# Patient Record
Sex: Male | Born: 1965 | Race: White | Hispanic: No | Marital: Married | Smoking: Never smoker
Health system: Southern US, Community
[De-identification: ages and names within clinical notes are randomized; demographics above are authoritative.]

## PROBLEM LIST (undated history)

## (undated) DIAGNOSIS — R7303 Prediabetes: Secondary | ICD-10-CM

## (undated) DIAGNOSIS — D369 Benign neoplasm, unspecified site: Secondary | ICD-10-CM

## (undated) DIAGNOSIS — N3281 Overactive bladder: Secondary | ICD-10-CM

## (undated) DIAGNOSIS — F1010 Alcohol abuse, uncomplicated: Secondary | ICD-10-CM

## (undated) DIAGNOSIS — K5790 Diverticulosis of intestine, part unspecified, without perforation or abscess without bleeding: Secondary | ICD-10-CM

## (undated) HISTORY — DX: Benign neoplasm, unspecified site: D36.9

## (undated) HISTORY — PX: TENDON REPAIR: SHX5111

## (undated) HISTORY — DX: Diverticulosis of intestine, part unspecified, without perforation or abscess without bleeding: K57.90

## (undated) HISTORY — DX: Prediabetes: R73.03

## (undated) HISTORY — DX: Overactive bladder: N32.81

## (undated) HISTORY — PX: HEMORRHOID BANDING: SHX5850

## (undated) HISTORY — PX: WISDOM TOOTH EXTRACTION: SHX21

## (undated) HISTORY — PX: INGUINAL HERNIA REPAIR: SUR1180

## (undated) HISTORY — PX: COLONOSCOPY W/ POLYPECTOMY: SHX1380

## (undated) HISTORY — PX: CYSTOSCOPY: SUR368

## (undated) HISTORY — DX: Alcohol abuse, uncomplicated: F10.10

## (undated) HISTORY — PX: OTHER SURGICAL HISTORY: SHX169

---

## 2001-08-16 ENCOUNTER — Encounter: Admission: RE | Admit: 2001-08-16 | Discharge: 2001-08-16 | Payer: Self-pay | Admitting: Neurosurgery

## 2001-08-16 ENCOUNTER — Encounter: Payer: Self-pay | Admitting: Neurosurgery

## 2001-08-30 ENCOUNTER — Encounter: Admission: RE | Admit: 2001-08-30 | Discharge: 2001-08-30 | Payer: Self-pay | Admitting: Neurosurgery

## 2001-08-30 ENCOUNTER — Encounter: Payer: Self-pay | Admitting: Neurosurgery

## 2001-09-14 ENCOUNTER — Ambulatory Visit (HOSPITAL_COMMUNITY): Admission: RE | Admit: 2001-09-14 | Discharge: 2001-09-14 | Payer: Self-pay | Admitting: Neurosurgery

## 2001-09-14 ENCOUNTER — Encounter: Payer: Self-pay | Admitting: Neurosurgery

## 2009-10-10 DIAGNOSIS — D369 Benign neoplasm, unspecified site: Secondary | ICD-10-CM

## 2009-10-10 HISTORY — DX: Benign neoplasm, unspecified site: D36.9

## 2009-10-10 HISTORY — PX: COLONOSCOPY W/ BIOPSIES: SHX1374

## 2013-11-04 ENCOUNTER — Encounter: Payer: Self-pay | Admitting: Internal Medicine

## 2013-12-26 ENCOUNTER — Encounter: Payer: Self-pay | Admitting: Internal Medicine

## 2013-12-26 ENCOUNTER — Ambulatory Visit (INDEPENDENT_AMBULATORY_CARE_PROVIDER_SITE_OTHER): Payer: PRIVATE HEALTH INSURANCE | Admitting: Internal Medicine

## 2013-12-26 VITALS — BP 118/72 | HR 80 | Ht 70.5 in | Wt 258.5 lb

## 2013-12-26 DIAGNOSIS — Z86010 Personal history of colonic polyps: Secondary | ICD-10-CM | POA: Insufficient documentation

## 2013-12-26 DIAGNOSIS — K429 Umbilical hernia without obstruction or gangrene: Secondary | ICD-10-CM

## 2013-12-26 DIAGNOSIS — K5792 Diverticulitis of intestine, part unspecified, without perforation or abscess without bleeding: Secondary | ICD-10-CM | POA: Insufficient documentation

## 2013-12-26 DIAGNOSIS — K5732 Diverticulitis of large intestine without perforation or abscess without bleeding: Secondary | ICD-10-CM

## 2013-12-26 DIAGNOSIS — K648 Other hemorrhoids: Secondary | ICD-10-CM | POA: Insufficient documentation

## 2013-12-26 MED ORDER — METRONIDAZOLE 500 MG PO TABS: 500.0000 mg | ORAL_TABLET | Freq: Three times a day (TID) | ORAL | Status: DC

## 2013-12-26 MED ORDER — CIPROFLOXACIN HCL 500 MG PO TABS: 500.0000 mg | ORAL_TABLET | Freq: Two times a day (BID) | ORAL | Status: DC

## 2013-12-26 NOTE — Assessment & Plan Note (Signed)
He has a hx compatible with this. Will Rx cipro and metronidazole to have at home and he is to call if he needs to start it.

## 2013-12-26 NOTE — Assessment & Plan Note (Signed)
RA banded today RTC 2-4 weeks for repeat banding

## 2013-12-26 NOTE — Assessment & Plan Note (Signed)
Recall colonoscopy 10/2014

## 2013-12-26 NOTE — Assessment & Plan Note (Signed)
Small and Asx - observation seems ok - he can schedule [facility] eval if desired
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
## 2013-12-26 NOTE — Patient Instructions (Addendum)
HEMORRHOID BANDING PROCEDURE    FOLLOW-UP CARE   1. The procedure you have had should have been relatively painless since the banding of the area involved does not have nerve endings and there is no pain sensation.  The rubber band cuts off the blood supply to the hemorrhoid and the band may fall off as soon as 48 hours after the banding (the band may occasionally be seen in the toilet bowl following a bowel movement). You may notice a temporary feeling of fullness in the rectum which should respond adequately to plain Tylenol or Motrin.  2. Following the banding, avoid strenuous exercise that evening and resume full activity the next day.  A sitz bath (soaking in a warm tub) or bidet is soothing, and can be useful for cleansing the area after bowel movements.     3. To avoid constipation, take two tablespoons of natural wheat bran, natural oat bran, flax, Benefiber or any over the counter fiber supplement and increase your water intake to 7-8 glasses daily.    4. Unless you have been prescribed anorectal medication, do not put anything inside your rectum for two weeks: No suppositories, enemas, fingers, etc.  5. Occasionally, you may have more bleeding than usual after the banding procedure.  This is often from the untreated hemorrhoids rather than the treated one.  Don't be concerned if there is a tablespoon or so of blood.  If there is more blood than this, lie flat with your bottom higher than your head and apply an ice pack to the area. If the bleeding does not stop within a half an hour or if you feel faint, call our office at [phone] or go to the emergency room.  6. Problems are not common; however, if there is a substantial amount of bleeding, severe pain, chills, fever or difficulty passing urine (very rare) or other problems, you should call us at [phone] or report to the nearest emergency room.  7. Do not stay seated continuously for more than 2-3 hours for a day or two  after the procedure.  Tighten your buttock muscles 10-15 times every two hours and take 10-15 deep breaths every 1-2 hours.  Do not spend more than a few minutes on the toilet if you cannot empty your bowel; instead re-visit the toilet at a later time.    We have sent the following medications to your pharmacy for you to pick up at your convenience: Flagyl and Cipro . You will have these on hand to use as needed.  Call us if you do have to use them.  We will put you in the system for a colon recall January 2016.  We will see you back at your next banding appointment.  We have given you information to read today on hemorrhoids.  I appreciate the opportunity to care for you.

## 2014-02-17 ENCOUNTER — Encounter: Payer: Self-pay | Admitting: Internal Medicine

## 2014-02-17 ENCOUNTER — Ambulatory Visit (INDEPENDENT_AMBULATORY_CARE_PROVIDER_SITE_OTHER): Payer: PRIVATE HEALTH INSURANCE | Admitting: Internal Medicine

## 2014-02-17 VITALS — BP 110/70 | HR 80 | Ht 70.5 in | Wt 257.8 lb

## 2014-02-17 DIAGNOSIS — K648 Other hemorrhoids: Secondary | ICD-10-CM

## 2014-02-17 NOTE — Assessment & Plan Note (Signed)
Notes some improvement in Sxs RP and LL banded He accepted slight increased risk of complications with banding x 2 today He has decided to f/u prn after waiting 2 months to see how he is

## 2014-02-17 NOTE — Progress Notes (Signed)
Patient ID: Francisco Carpenter, male   DOB: Oct 04, 1966, 48 y.o.   MRN: [id number]         The patient returns after I banded RA hemorrhoid column about 6-7 weeks ago. He believes there is improvement in sxs of fecal seepage, itching and rectal bleeding.  PROCEDURE NOTE: The patient presents with symptomatic grade 2  hemorrhoids, requesting rubber band ligation of his/her hemorrhoidal disease.  All risks, benefits and alternative forms of therapy were described and informed consent was obtained.   The anorectum was pre-medicated with 5% lidocaine cream and 0.125% NTG ointment The decision was made to  Band the RP and LL internal hemorrhoids  and the [place] was used to perform band ligation without complication.  Digital anorectal examination was then performed to assure proper positioning of the band, and to adjust the banded tissue as required.  The patient was discharged home without pain or other issues.  Dietary and behavioral recommendations were given and along with follow-up instructions.     The patient will return as needed for  follow-up and possible additional banding as required. No complications were encountered and the patient tolerated the procedure well.  I appreciate the opportunity to care for this patient.

## 2014-02-17 NOTE — Patient Instructions (Signed)
HEMORRHOID BANDING PROCEDURE    FOLLOW-UP CARE   1. The procedure you have had should have been relatively painless since the banding of the area involved does not have nerve endings and there is no pain sensation.  The rubber band cuts off the blood supply to the hemorrhoid and the band may fall off as soon as 48 hours after the banding (the band may occasionally be seen in the toilet bowl following a bowel movement). You may notice a temporary feeling of fullness in the rectum which should respond adequately to plain Tylenol or Motrin.  2. Following the banding, avoid strenuous exercise that evening and resume full activity the next day.  A sitz bath (soaking in a warm tub) or bidet is soothing, and can be useful for cleansing the area after bowel movements.     3. To avoid constipation, take two tablespoons of natural wheat bran, natural oat bran, flax, Benefiber or any over the counter fiber supplement and increase your water intake to 7-8 glasses daily.    4. Unless you have been prescribed anorectal medication, do not put anything inside your rectum for two weeks: No suppositories, enemas, fingers, etc.  5. Occasionally, you may have more bleeding than usual after the banding procedure.  This is often from the untreated hemorrhoids rather than the treated one.  Don't be concerned if there is a tablespoon or so of blood.  If there is more blood than this, lie flat with your bottom higher than your head and apply an ice pack to the area. If the bleeding does not stop within a half an hour or if you feel faint, call our office at [phone] or go to the emergency room.  6. Problems are not common; however, if there is a substantial amount of bleeding, severe pain, chills, fever or difficulty passing urine (very rare) or other problems, you should call us at [phone] or report to the nearest emergency room.  7. Do not stay seated continuously for more than 2-3 hours for a day or two  after the procedure.  Tighten your buttock muscles 10-15 times every two hours and take 10-15 deep breaths every 1-2 hours.  Do not spend more than a few minutes on the toilet if you cannot empty your bowel; instead re-visit the toilet at a later time.    Call us back as needed.    I appreciate the opportunity to care for you.

## 2014-08-26 ENCOUNTER — Other Ambulatory Visit: Payer: Self-pay | Admitting: Internal Medicine

## 2014-08-26 NOTE — Telephone Encounter (Signed)
May refill x1 

## 2014-08-26 NOTE — Telephone Encounter (Signed)
Spoke with patient , he had a flare 3 to 4 weeks ago and used his rx so he wants to get another to have if he needs it.  Will this be ok Sir?

## 2014-10-20 ENCOUNTER — Encounter: Payer: Self-pay | Admitting: Internal Medicine

## 2014-12-12 ENCOUNTER — Ambulatory Visit (AMBULATORY_SURGERY_CENTER): Payer: Self-pay

## 2014-12-12 VITALS — Ht 72.0 in | Wt 255.0 lb

## 2014-12-12 DIAGNOSIS — Z860100 Personal history of colon polyps, unspecified: Secondary | ICD-10-CM

## 2014-12-12 NOTE — Progress Notes (Signed)
No allergies to eggs or soy No diet/weight loss meds No home oxygen No past problems with anesthesia  Has email  Emmi instructions given for colonoscopy 

## 2014-12-26 ENCOUNTER — Ambulatory Visit (AMBULATORY_SURGERY_CENTER): Payer: BLUE CROSS/BLUE SHIELD | Admitting: Internal Medicine

## 2014-12-26 ENCOUNTER — Encounter: Payer: Self-pay | Admitting: Internal Medicine

## 2014-12-26 VITALS — BP 101/66 | HR 70 | Temp 97.7°F | Resp 21 | Ht 72.0 in | Wt 255.0 lb

## 2014-12-26 DIAGNOSIS — D128 Benign neoplasm of rectum: Secondary | ICD-10-CM

## 2014-12-26 DIAGNOSIS — D129 Benign neoplasm of anus and anal canal: Secondary | ICD-10-CM

## 2014-12-26 DIAGNOSIS — K5730 Diverticulosis of large intestine without perforation or abscess without bleeding: Secondary | ICD-10-CM

## 2014-12-26 DIAGNOSIS — K648 Other hemorrhoids: Secondary | ICD-10-CM

## 2014-12-26 DIAGNOSIS — Z86010 Personal history of colonic polyps: Secondary | ICD-10-CM

## 2014-12-26 DIAGNOSIS — K621 Rectal polyp: Secondary | ICD-10-CM

## 2014-12-26 MED ORDER — SODIUM CHLORIDE 0.9 % IV SOLN: 500.0000 mL | INTRAVENOUS | Status: DC

## 2014-12-26 NOTE — Patient Instructions (Addendum)
I found and removed 2 tiny polyps. I will let you know pathology results and when to have another routine colonoscopy by mail.  If you feel like the hemorrhoids are bleeding too much please return to see me about additional banding.  I appreciate the opportunity to care for you. Gatha Mayer, MD, FACG  YOU HAD AN ENDOSCOPIC PROCEDURE TODAY AT [facility]:   Refer to the procedure report that was given to you for any specific questions about what was found during the examination.  If the procedure report does not answer your questions, please call your gastroenterologist to clarify.  If you requested that your care partner not be given the details of your procedure findings, then the procedure report has been included in a sealed envelope for you to review at your convenience later.  YOU SHOULD EXPECT: Some feelings of bloating in the abdomen. Passage of more gas than usual.  Walking can help get rid of the air that was put into your GI tract during the procedure and reduce the bloating. If you had a lower endoscopy (such as a colonoscopy or flexible sigmoidoscopy) you may notice spotting of blood in your stool or on the toilet paper. If you underwent a bowel prep for your procedure, you may not have a normal bowel movement for a few days.  Please Note:  You might notice some irritation and congestion in your nose or some drainage.  This is from the oxygen used during your procedure.  There is no need for concern and it should clear up in a day or so.  SYMPTOMS TO REPORT IMMEDIATELY:   Following lower endoscopy (colonoscopy or flexible sigmoidoscopy):  Excessive amounts of blood in the stool  Significant tenderness or worsening of abdominal pains  Swelling of the abdomen that is new, acute  Fever of 100F or higher     For urgent or emergent issues, a gastroenterologist can be reached at any hour by calling [phone].   DIET: Your first meal following the  procedure should be a small meal and then it is ok to progress to your normal diet. Heavy or fried foods are harder to digest and may make you feel nauseous or bloated.  Likewise, meals heavy in dairy and vegetables can increase bloating.  Drink plenty of fluids but you should avoid alcoholic beverages for 24 hours.  ACTIVITY:  You should plan to take it easy for the rest of today and you should NOT DRIVE or use heavy machinery until tomorrow (because of the sedation medicines used during the test).    FOLLOW UP: Our staff will call the number listed on your records the next business day following your procedure to check on you and address any questions or concerns that you may have regarding the information given to you following your procedure. If we do not reach you, we will leave a message.  However, if you are feeling well and you are not experiencing any problems, there is no need to return our call.  We will assume that you have returned to your regular daily activities without incident.  If any biopsies were taken you will be contacted by phone or by letter within the next 1-3 weeks.  Please call us at [phone] if you have not heard about the biopsies in 3 weeks.    SIGNATURES/CONFIDENTIALITY: You and/or your care partner have signed paperwork which will be entered into your electronic medical record.  These signatures attest  to the fact that that the information above on your After Visit Summary has been reviewed and is understood.  Full responsibility of the confidentiality of this discharge information lies with you and/or your care-partner.  Polyp and hemorrhoid information given.

## 2014-12-26 NOTE — Progress Notes (Signed)
Called to room to assist during endoscopic procedure.  Patient ID and intended procedure confirmed with present staff. Received instructions for my participation in the procedure from the performing physician.  

## 2014-12-26 NOTE — Assessment & Plan Note (Signed)
Still having some seepage and rare bleeding. Overall better. Desires repeat treatment to try to resolve problems. Will schedule repeat banding.

## 2014-12-26 NOTE — Op Note (Signed)
[facility]  [address] [place], [id number]   COLONOSCOPY PROCEDURE REPORT  PATIENT: Francisco Carpenter, Francisco Carpenter  MR#: [id number] BIRTHDATE: 12/26/1965 , 48  yrs. old GENDER: male ENDOSCOPIST: Gatha Mayer, MD, [facility] PROCEDURE DATE:  12/26/2014 PROCEDURE:   Colonoscopy, surveillance and Colonoscopy with biopsy First Screening Colonoscopy - Avg.  risk and is 50 yrs.  old or older - No.  Prior Negative Screening - Now for repeat screening. N/A  History of Adenoma - Now for follow-up colonoscopy & has been > or = to 3 yrs.  Yes hx of adenoma.  Has been 3 or more years since last colonoscopy. ASA CLASS:   Class II INDICATIONS:Surveillance due to prior colonic neoplasia and PH Colon Adenoma. MEDICATIONS: Propofol 370 mg IV and Monitored anesthesia care  DESCRIPTION OF PROCEDURE:   After the risks benefits and alternatives of the procedure were thoroughly explained, informed consent was obtained.  The digital rectal exam revealed no abnormalities of the rectum, revealed no prostatic nodules, and revealed the prostate was not enlarged.   The LB [id number] endoscope was introduced through the anus and advanced to the cecum, which was identified by both the appendix and ileocecal valve. No adverse events experienced.   The quality of the prep was good.  (MiraLax was used)  The instrument was then slowly withdrawn as the colon was fully examined.   COLON FINDINGS: Two sessile polyps ranging from 1 to 35mm in size were found in the rectum.  Polypectomies were performed with cold forceps.  The resection was complete, the polyp tissue was completely retrieved and sent to histology.   There was severe diverticulosis noted throughout the entire examined colon. Retroflexed views revealed internal hemorrhoids. The time to cecum = 3.4 Withdrawal time = 11.5   The scope was withdrawn and the procedure completed. COMPLICATIONS: There were no immediate complications.  ENDOSCOPIC  IMPRESSION: 1.   Two sessile polyps ranging from 1 to 49mm in size were found in the rectum; polypectomies were performed with cold forceps 2.   Severe diverticulosis was noted throughout the entire examined colon 3.   Internal hemorrhoids  RECOMMENDATIONS: Timing of repeat colonoscopy will be determined by pathology findings.  eSigned:  Gatha Mayer, MD, [facility] 12/26/2014 2:42 PM   cc:  The Patient

## 2014-12-26 NOTE — Progress Notes (Signed)
Procedure ends, to recovery, report to [place], Therapist, sports. VSS

## 2014-12-29 ENCOUNTER — Telehealth: Payer: Self-pay

## 2014-12-29 NOTE — Telephone Encounter (Signed)
  Follow up Call-  Call back number 12/26/2014  Post procedure Call Back phone  # [phone]  Permission to leave phone message Yes     Patient questions:  Do you have a fever, pain , or abdominal swelling? No. Pain Score  0 *  Have you tolerated food without any problems? Yes.    Have you been able to return to your normal activities? Yes.    Do you have any questions about your discharge instructions: Diet   No. Medications  No. Follow up visit  No.  Do you have questions or concerns about your Care? No.  Actions: * If pain score is 4 or above: No action needed, pain <4.

## 2014-12-31 ENCOUNTER — Encounter: Payer: Self-pay | Admitting: Internal Medicine

## 2014-12-31 NOTE — Progress Notes (Signed)
Quick Note:  Hyperplastic rectal polyps Repeat colon 2026 ______

## 2015-01-20 ENCOUNTER — Telehealth: Payer: Self-pay | Admitting: Internal Medicine

## 2015-01-20 NOTE — Telephone Encounter (Signed)
I spoke with the patient and rescheduled him for 02/20/15

## 2015-01-22 ENCOUNTER — Encounter: Payer: BLUE CROSS/BLUE SHIELD | Admitting: Internal Medicine

## 2015-02-19 ENCOUNTER — Encounter: Payer: Self-pay | Admitting: Internal Medicine

## 2015-02-19 ENCOUNTER — Ambulatory Visit (INDEPENDENT_AMBULATORY_CARE_PROVIDER_SITE_OTHER): Payer: BLUE CROSS/BLUE SHIELD | Admitting: Internal Medicine

## 2015-02-19 VITALS — BP 120/64 | HR 80 | Ht 70.5 in | Wt 258.1 lb

## 2015-02-19 DIAGNOSIS — K648 Other hemorrhoids: Secondary | ICD-10-CM | POA: Diagnosis not present

## 2015-02-19 NOTE — Progress Notes (Signed)
Patient ID: Francisco Carpenter, male   DOB: 1966-10-02, 49 y.o.   MRN: [id number]       He has had some recurrent bleeding and fecal soiling, was banded x 3 2015.  Rectal shows no mass, small LL external hemorrhoid swelling.   Anoscopy was performed with the patient in the left lateral decubitus position while a chaperone was present and revealed Gr 2 internal hemorrhoids LL and RP and Grade 1 RA  PROCEDURE NOTE: The patient presents with symptomatic grade 2  hemorrhoids, requesting rubber band ligation of his/her hemorrhoidal disease.  All risks, benefits and alternative forms of therapy were described and informed consent was obtained.   The anorectum was pre-medicated with 0.125% NTG and 5% liodcaine The decision was made to band the LL and RP internal hemorrhoid, and the [place] was used to perform band ligation without complication.  Digital anorectal examination was then performed to assure proper positioning of the band, and to adjust the banded tissue as required.  The patient was discharged home without pain or other issues.  Dietary and behavioral recommendations were given and along with follow-up instructions.     The following adjunctive treatments were recommended:  Benefiber prn  The patient will return in prn for  follow-up and possible additional banding as required. No complications were encountered and the patient tolerated the procedure well.

## 2015-02-19 NOTE — Patient Instructions (Signed)
HEMORRHOID BANDING PROCEDURE    FOLLOW-UP CARE   1. The procedure you have had should have been relatively painless since the banding of the area involved does not have nerve endings and there is no pain sensation.  The rubber band cuts off the blood supply to the hemorrhoid and the band may fall off as soon as 48 hours after the banding (the band may occasionally be seen in the toilet bowl following a bowel movement). You may notice a temporary feeling of fullness in the rectum which should respond adequately to plain Tylenol or Motrin.  2. Following the banding, avoid strenuous exercise that evening and resume full activity the next day.  A sitz bath (soaking in a warm tub) or bidet is soothing, and can be useful for cleansing the area after bowel movements.     3. To avoid constipation, take two tablespoons of natural wheat bran, natural oat bran, flax, Benefiber or any over the counter fiber supplement and increase your water intake to 7-8 glasses daily.    4. Unless you have been prescribed anorectal medication, do not put anything inside your rectum for two weeks: No suppositories, enemas, fingers, etc.  5. Occasionally, you may have more bleeding than usual after the banding procedure.  This is often from the untreated hemorrhoids rather than the treated one.  Don't be concerned if there is a tablespoon or so of blood.  If there is more blood than this, lie flat with your bottom higher than your head and apply an ice pack to the area. If the bleeding does not stop within a half an hour or if you feel faint, call our office at [phone] or go to the emergency room.  6. Problems are not common; however, if there is a substantial amount of bleeding, severe pain, chills, fever or difficulty passing urine (very rare) or other problems, you should call us at [phone] or report to the nearest emergency room.  7. Do not stay seated continuously for more than 2-3 hours for a day or two  after the procedure.  Tighten your buttock muscles 10-15 times every two hours and take 10-15 deep breaths every 1-2 hours.  Do not spend more than a few minutes on the toilet if you cannot empty your bowel; instead re-visit the toilet at a later time.    Follow up with Dr Gessner as needed.   I appreciate the opportunity to care for you.  

## 2015-02-19 NOTE — Assessment & Plan Note (Signed)
RP and LL banded 

## 2016-03-25 ENCOUNTER — Other Ambulatory Visit: Payer: Self-pay | Admitting: Neurosurgery

## 2016-03-29 NOTE — Progress Notes (Signed)
Please complete pt assessment on DOS. Pre-op instructions provided only. Pt made aware to stop  taking Aspirin, vitamins, fish oil and herbal medications. Do not take any NSAIDs ie: Ibuprofen, Advil, Naproxen, BC and Goody Powder, Voltaren or any medication containing Aspirin. Pt verbalized understanding of all pre-op instructions.

## 2016-03-30 ENCOUNTER — Encounter (HOSPITAL_COMMUNITY)
Admission: RE | Disposition: A | Discharge status: Home / Self Care | Payer: Self-pay | Source: Ambulatory Visit | Attending: Neurosurgery

## 2016-03-30 ENCOUNTER — Ambulatory Visit (HOSPITAL_COMMUNITY): Payer: BLUE CROSS/BLUE SHIELD

## 2016-03-30 ENCOUNTER — Ambulatory Visit (HOSPITAL_COMMUNITY): Payer: BLUE CROSS/BLUE SHIELD | Admitting: Anesthesiology

## 2016-03-30 ENCOUNTER — Encounter (HOSPITAL_COMMUNITY): Payer: Self-pay | Admitting: *Deleted

## 2016-03-30 ENCOUNTER — Ambulatory Visit (HOSPITAL_COMMUNITY)
Admission: RE | Admit: 2016-03-30 | Discharge: 2016-03-30 | Disposition: A | Discharge status: Home / Self Care | Payer: BLUE CROSS/BLUE SHIELD | Source: Ambulatory Visit | Attending: Neurosurgery | Admitting: Neurosurgery

## 2016-03-30 DIAGNOSIS — M5127 Other intervertebral disc displacement, lumbosacral region: Secondary | ICD-10-CM | POA: Diagnosis present

## 2016-03-30 DIAGNOSIS — Z419 Encounter for procedure for purposes other than remedying health state, unspecified: Secondary | ICD-10-CM

## 2016-03-30 DIAGNOSIS — M5126 Other intervertebral disc displacement, lumbar region: Secondary | ICD-10-CM | POA: Diagnosis present

## 2016-03-30 HISTORY — PX: LUMBAR LAMINECTOMY/DECOMPRESSION MICRODISCECTOMY: SHX5026

## 2016-03-30 LAB — CBC
HCT: 40.3 % (ref 39.0–52.0)
Hemoglobin: 13.6 g/dL (ref 13.0–17.0)
MCH: 28.8 pg (ref 26.0–34.0)
MCHC: 33.7 g/dL (ref 30.0–36.0)
MCV: 85.2 fL (ref 78.0–100.0)
PLATELETS: 230 10*3/uL (ref 150–400)
RBC: 4.73 MIL/uL (ref 4.22–5.81)
RDW: 12.1 % (ref 11.5–15.5)
WBC: 5.2 10*3/uL (ref 4.0–10.5)

## 2016-03-30 LAB — SURGICAL PCR SCREEN
MRSA, PCR: NEGATIVE
STAPHYLOCOCCUS AUREUS: NEGATIVE

## 2016-03-30 LAB — COMPREHENSIVE METABOLIC PANEL
ALBUMIN: 4 g/dL (ref 3.5–5.0)
ALT: 30 U/L (ref 17–63)
AST: 23 U/L (ref 15–41)
Alkaline Phosphatase: 47 U/L (ref 38–126)
Anion gap: 8 (ref 5–15)
BUN: 18 mg/dL (ref 6–20)
CHLORIDE: 110 mmol/L (ref 101–111)
CO2: 23 mmol/L (ref 22–32)
Calcium: 9.1 mg/dL (ref 8.9–10.3)
Creatinine, Ser: 0.81 mg/dL (ref 0.61–1.24)
GFR calc Af Amer: >60 mL/min (ref >60)
GLUCOSE: 98 mg/dL (ref 65–99)
POTASSIUM: 4.1 mmol/L (ref 3.5–5.1)
SODIUM: 141 mmol/L (ref 135–145)
Total Bilirubin: 0.7 mg/dL (ref 0.3–1.2)
Total Protein: 6.5 g/dL (ref 6.5–8.1)

## 2016-03-30 SURGERY — LUMBAR LAMINECTOMY/DECOMPRESSION MICRODISCECTOMY 1 LEVEL
Anesthesia: General | Site: Spine Lumbar

## 2016-03-30 MED ORDER — MIDAZOLAM HCL 5 MG/5ML IJ SOLN
INTRAMUSCULAR | Status: DC | PRN
  Administered 2016-03-30: 2 mg via INTRAVENOUS

## 2016-03-30 MED ORDER — LACTATED RINGERS IV SOLN
INTRAVENOUS | Status: DC
  Administered 2016-03-30 (×2): via INTRAVENOUS

## 2016-03-30 MED ORDER — ONDANSETRON HCL 4 MG/2ML IJ SOLN: 4.0000 mg | INTRAMUSCULAR | Status: DC | PRN

## 2016-03-30 MED ORDER — DIAZEPAM 5 MG PO TABS: 5.0000 mg | ORAL_TABLET | Freq: Four times a day (QID) | ORAL | Status: DC | PRN

## 2016-03-30 MED ORDER — FENTANYL CITRATE (PF) 100 MCG/2ML IJ SOLN
INTRAMUSCULAR | Status: DC | PRN
  Administered 2016-03-30: 150 ug via INTRAVENOUS
  Administered 2016-03-30 (×3): 50 ug via INTRAVENOUS

## 2016-03-30 MED ORDER — PROPOFOL 10 MG/ML IV BOLUS
INTRAVENOUS | Status: DC | PRN
  Administered 2016-03-30: 30 mg via INTRAVENOUS
  Administered 2016-03-30: 150 mg via INTRAVENOUS

## 2016-03-30 MED ORDER — SODIUM CHLORIDE 0.9% FLUSH: 3.0000 mL | INTRAVENOUS | Status: DC | PRN

## 2016-03-30 MED ORDER — ONDANSETRON HCL 4 MG/2ML IJ SOLN
INTRAMUSCULAR | Status: AC
  Filled 2016-03-30: qty 2

## 2016-03-30 MED ORDER — LACTATED RINGERS IV SOLN
INTRAVENOUS | Status: DC
Start: 2016-03-30 — End: 2016-03-30
  Administered 2016-03-30: 13:00:00 via INTRAVENOUS

## 2016-03-30 MED ORDER — PROMETHAZINE HCL 25 MG/ML IJ SOLN: 6.2500 mg | INTRAMUSCULAR | Status: DC | PRN

## 2016-03-30 MED ORDER — TAMSULOSIN HCL 0.4 MG PO CAPS: 0.4000 mg | ORAL_CAPSULE | Freq: Every day | ORAL | Status: DC

## 2016-03-30 MED ORDER — BUPIVACAINE HCL (PF) 0.5 % IJ SOLN
INTRAMUSCULAR | Status: DC | PRN
  Administered 2016-03-30: 12 mL

## 2016-03-30 MED ORDER — PHENOL 1.4 % MT LIQD: 1.0000 | OROMUCOSAL | Status: DC | PRN

## 2016-03-30 MED ORDER — FENTANYL CITRATE (PF) 250 MCG/5ML IJ SOLN
INTRAMUSCULAR | Status: AC
  Filled 2016-03-30: qty 5

## 2016-03-30 MED ORDER — LIDOCAINE-EPINEPHRINE 0.5 %-1:200000 IJ SOLN
INTRAMUSCULAR | Status: DC | PRN
  Administered 2016-03-30: 4 mL

## 2016-03-30 MED ORDER — TRAMADOL HCL 50 MG PO TABS
50.0000 mg | ORAL_TABLET | Freq: Four times a day (QID) | ORAL | Status: DC | PRN
Start: 2016-03-30 — End: 2016-03-31

## 2016-03-30 MED ORDER — KETOROLAC TROMETHAMINE 30 MG/ML IJ SOLN: 30.0000 mg | Freq: Four times a day (QID) | INTRAMUSCULAR | Status: DC

## 2016-03-30 MED ORDER — HEMOSTATIC AGENTS (NO CHARGE) OPTIME
TOPICAL | Status: DC | PRN
  Administered 2016-03-30: 1 via TOPICAL

## 2016-03-30 MED ORDER — HYDROCODONE-ACETAMINOPHEN 7.5-325 MG PO TABS: 1.0000 | ORAL_TABLET | Freq: Once | ORAL | Status: DC | PRN

## 2016-03-30 MED ORDER — TIZANIDINE HCL 4 MG PO TABS: 4.0000 mg | ORAL_TABLET | Freq: Four times a day (QID) | ORAL | Status: DC | PRN

## 2016-03-30 MED ORDER — MIDAZOLAM HCL 2 MG/2ML IJ SOLN
INTRAMUSCULAR | Status: AC
  Filled 2016-03-30: qty 2

## 2016-03-30 MED ORDER — ONDANSETRON HCL 4 MG/2ML IJ SOLN
INTRAMUSCULAR | Status: DC | PRN
  Administered 2016-03-30: 4 mg via INTRAVENOUS

## 2016-03-30 MED ORDER — CERAVE EX OINT: 1.0000 "application " | TOPICAL_OINTMENT | Freq: Every day | CUTANEOUS | Status: DC | PRN

## 2016-03-30 MED ORDER — PROPOFOL 10 MG/ML IV BOLUS
INTRAVENOUS | Status: AC
  Filled 2016-03-30: qty 20

## 2016-03-30 MED ORDER — HYDROMORPHONE HCL 1 MG/ML IJ SOLN
INTRAMUSCULAR | Status: AC
  Administered 2016-03-30: 0.5 mg via INTRAVENOUS
  Filled 2016-03-30: qty 1

## 2016-03-30 MED ORDER — ROCURONIUM BROMIDE 100 MG/10ML IV SOLN
INTRAVENOUS | Status: DC | PRN
  Administered 2016-03-30: 50 mg via INTRAVENOUS

## 2016-03-30 MED ORDER — OXYBUTYNIN CHLORIDE ER 10 MG PO TB24: 10.0000 mg | ORAL_TABLET | Freq: Every day | ORAL | Status: DC

## 2016-03-30 MED ORDER — LIDOCAINE HCL (CARDIAC) 20 MG/ML IV SOLN
INTRAVENOUS | Status: DC | PRN
  Administered 2016-03-30: 80 mg via INTRAVENOUS

## 2016-03-30 MED ORDER — HYDROCODONE-ACETAMINOPHEN 5-325 MG PO TABS: 1.0000 | ORAL_TABLET | ORAL | Status: DC | PRN

## 2016-03-30 MED ORDER — ROCURONIUM BROMIDE 50 MG/5ML IV SOLN
INTRAVENOUS | Status: AC
  Filled 2016-03-30: qty 1

## 2016-03-30 MED ORDER — THROMBIN 5000 UNITS EX SOLR
CUTANEOUS | Status: DC | PRN
  Administered 2016-03-30 (×2): 5000 [IU] via TOPICAL

## 2016-03-30 MED ORDER — PHENYLEPHRINE HCL 10 MG/ML IJ SOLN
INTRAMUSCULAR | Status: DC | PRN
  Administered 2016-03-30: 40 ug via INTRAVENOUS

## 2016-03-30 MED ORDER — SODIUM CHLORIDE 0.9 % IV SOLN: 250.0000 mL | INTRAVENOUS | Status: DC

## 2016-03-30 MED ORDER — MUPIROCIN 2 % EX OINT
TOPICAL_OINTMENT | CUTANEOUS | Status: AC
  Filled 2016-03-30: qty 22

## 2016-03-30 MED ORDER — PHENYLEPHRINE 40 MCG/ML (10ML) SYRINGE FOR IV PUSH (FOR BLOOD PRESSURE SUPPORT)
PREFILLED_SYRINGE | INTRAVENOUS | Status: AC
  Filled 2016-03-30: qty 10

## 2016-03-30 MED ORDER — HYDROMORPHONE HCL 1 MG/ML IJ SOLN: 0.5000 mg | INTRAMUSCULAR | Status: DC | PRN

## 2016-03-30 MED ORDER — ACETAMINOPHEN 650 MG RE SUPP: 650.0000 mg | RECTAL | Status: DC | PRN

## 2016-03-30 MED ORDER — 0.9 % SODIUM CHLORIDE (POUR BTL) OPTIME
TOPICAL | Status: DC | PRN
  Administered 2016-03-30: 1000 mL

## 2016-03-30 MED ORDER — SODIUM CHLORIDE 0.9% FLUSH: 3.0000 mL | Freq: Two times a day (BID) | INTRAVENOUS | Status: DC

## 2016-03-30 MED ORDER — POTASSIUM CHLORIDE IN NACL 20-0.9 MEQ/L-% IV SOLN
INTRAVENOUS | Status: DC
Start: 2016-03-30 — End: 2016-03-31
  Filled 2016-03-30 (×2): qty 1000

## 2016-03-30 MED ORDER — ACETAMINOPHEN 325 MG PO TABS: 650.0000 mg | ORAL_TABLET | ORAL | Status: DC | PRN

## 2016-03-30 MED ORDER — CEFAZOLIN SODIUM-DEXTROSE 2-4 GM/100ML-% IV SOLN
2.0000 g | INTRAVENOUS | Status: AC
  Administered 2016-03-30: 2 g via INTRAVENOUS
  Filled 2016-03-30: qty 100

## 2016-03-30 MED ORDER — SUGAMMADEX SODIUM 200 MG/2ML IV SOLN
INTRAVENOUS | Status: DC | PRN
  Administered 2016-03-30: 215 mg via INTRAVENOUS

## 2016-03-30 MED ORDER — MENTHOL 3 MG MT LOZG: 1.0000 | LOZENGE | OROMUCOSAL | Status: DC | PRN

## 2016-03-30 MED ORDER — HYDROMORPHONE HCL 1 MG/ML IJ SOLN
0.2500 mg | INTRAMUSCULAR | Status: DC | PRN
  Administered 2016-03-30 (×3): 0.5 mg via INTRAVENOUS

## 2016-03-30 SURGICAL SUPPLY — 57 items
APL SKNCLS STERI-STRIP NONHPOA (GAUZE/BANDAGES/DRESSINGS)
BAG DECANTER FOR FLEXI CONT (MISCELLANEOUS) ×1 IMPLANT
BENZOIN TINCTURE PRP APPL 2/3 (GAUZE/BANDAGES/DRESSINGS) IMPLANT
BLADE CLIPPER SURG (BLADE) ×1 IMPLANT
BUR MATCHSTICK NEURO 3.0 LAGG (BURR) ×2 IMPLANT
CANISTER SUCT 3000ML PPV (MISCELLANEOUS) ×2 IMPLANT
DECANTER SPIKE VIAL GLASS SM (MISCELLANEOUS) ×2 IMPLANT
DRAPE LAPAROTOMY 100X72X124 (DRAPES) ×2 IMPLANT
DRAPE MICROSCOPE LEICA (MISCELLANEOUS) ×2 IMPLANT
DRAPE POUCH INSTRU U-SHP 10X18 (DRAPES) ×2 IMPLANT
DRAPE SURG 17X23 STRL (DRAPES) ×2 IMPLANT
DURAPREP 26ML APPLICATOR (WOUND CARE) ×2 IMPLANT
ELECT REM PT RETURN 9FT ADLT (ELECTROSURGICAL) ×2
ELECTRODE REM PT RTRN 9FT ADLT (ELECTROSURGICAL) ×1 IMPLANT
GAUZE SPONGE 4X4 12PLY STRL (GAUZE/BANDAGES/DRESSINGS) IMPLANT
GAUZE SPONGE 4X4 16PLY XRAY LF (GAUZE/BANDAGES/DRESSINGS) IMPLANT
GLOVE BIO SURGEON STRL SZ 6.5 (GLOVE) ×2 IMPLANT
GLOVE BIOGEL PI IND STRL 6.5 (GLOVE) IMPLANT
GLOVE BIOGEL PI IND STRL 7.0 (GLOVE) IMPLANT
GLOVE BIOGEL PI IND STRL 7.5 (GLOVE) IMPLANT
GLOVE BIOGEL PI INDICATOR 6.5 (GLOVE) ×1
GLOVE BIOGEL PI INDICATOR 7.0 (GLOVE) ×1
GLOVE BIOGEL PI INDICATOR 7.5 (GLOVE) ×1
GLOVE ECLIPSE 6.5 STRL STRAW (GLOVE) ×2 IMPLANT
GLOVE EXAM NITRILE LRG STRL (GLOVE) IMPLANT
GLOVE EXAM NITRILE MD LF STRL (GLOVE) IMPLANT
GLOVE EXAM NITRILE XL STR (GLOVE) IMPLANT
GLOVE EXAM NITRILE XS STR PU (GLOVE) IMPLANT
GLOVE SS BIOGEL STRL SZ 7 (GLOVE) IMPLANT
GLOVE SUPERSENSE BIOGEL SZ 7 (GLOVE) ×1
GLOVE SURG SS PI 6.5 STRL IVOR (GLOVE) ×2 IMPLANT
GOWN STRL REUS W/ TWL LRG LVL3 (GOWN DISPOSABLE) ×2 IMPLANT
GOWN STRL REUS W/ TWL XL LVL3 (GOWN DISPOSABLE) IMPLANT
GOWN STRL REUS W/TWL 2XL LVL3 (GOWN DISPOSABLE) IMPLANT
GOWN STRL REUS W/TWL LRG LVL3 (GOWN DISPOSABLE) ×8
GOWN STRL REUS W/TWL XL LVL3 (GOWN DISPOSABLE)
KIT BASIN OR (CUSTOM PROCEDURE TRAY) ×2 IMPLANT
KIT ROOM TURNOVER OR (KITS) ×2 IMPLANT
LIQUID BAND (GAUZE/BANDAGES/DRESSINGS) ×2 IMPLANT
NDL HYPO 25X1 1.5 SAFETY (NEEDLE) ×1 IMPLANT
NDL SPNL 18GX3.5 QUINCKE PK (NEEDLE) IMPLANT
NEEDLE HYPO 25X1 1.5 SAFETY (NEEDLE) ×2 IMPLANT
NEEDLE SPNL 18GX3.5 QUINCKE PK (NEEDLE) ×2 IMPLANT
NS IRRIG 1000ML POUR BTL (IV SOLUTION) ×2 IMPLANT
PACK LAMINECTOMY NEURO (CUSTOM PROCEDURE TRAY) ×2 IMPLANT
PAD ARMBOARD 7.5X6 YLW CONV (MISCELLANEOUS) ×8 IMPLANT
RUBBERBAND STERILE (MISCELLANEOUS) ×4 IMPLANT
SPONGE LAP 4X18 X RAY DECT (DISPOSABLE) IMPLANT
SPONGE SURGIFOAM ABS GEL SZ50 (HEMOSTASIS) ×2 IMPLANT
STRIP CLOSURE SKIN 1/2X4 (GAUZE/BANDAGES/DRESSINGS) IMPLANT
SUT VIC AB 0 CT1 18XCR BRD8 (SUTURE) ×1 IMPLANT
SUT VIC AB 0 CT1 8-18 (SUTURE) ×2
SUT VIC AB 2-0 CT1 18 (SUTURE) ×2 IMPLANT
SUT VIC AB 3-0 SH 8-18 (SUTURE) ×2 IMPLANT
TOWEL OR 17X24 6PK STRL BLUE (TOWEL DISPOSABLE) ×2 IMPLANT
TOWEL OR 17X26 10 PK STRL BLUE (TOWEL DISPOSABLE) ×2 IMPLANT
WATER STERILE IRR 1000ML POUR (IV SOLUTION) ×2 IMPLANT

## 2016-03-30 NOTE — Op Note (Signed)
03/30/2016  5:03 PM  PATIENT:  Claudette Stapler  50 y.o. 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 Buczek was taken to the operating room, intubated and placed under a general anesthetic without difficulty. He was positioned prone on a Wilson frame with all pressure points padded. His back was prepped and draped in a sterile manner. I opened the skin with a 10 blade and carried the dissection down to the thoracolumbar fascia. I used both sharp dissection and the monopolar cautery to expose the lamina of L5, and S1. I confirmed my location with an intraoperative xray.  I used the straight curette to separate the ligamentum flavum from the L5 lamina, then I used the punches to remove the ligamentum flavum to expose the thecal sac. I brought the microscope into the operative field and with Dr.Ditty's assistance we started our decompression of the spinal canal, thecal sac and S1 root(s). I cauterized epidural veins overlying the disc space then divided them sharply. I opened the disc space with a 15 blade and proceeded with the discectomy. I used pituitary rongeurs, curettes, and other instruments to remove disc material. After the discectomy was completed we inspected the S1, nerve root and felt it was well decompressed. I explored rostrally, laterally, medially, and caudally and was satisfied with the decompression. I irrigated the wound, then closed in layers. I  approximated the thoracolumbar fascia, subcutaneous, and subcuticular planes with vicryl sutures. I used dermabond for a sterile dressing.   PLAN OF CARE: Admit for overnight observation  PATIENT DISPOSITION:  PACU - hemodynamically stable.   Delay start of Pharmacological VTE agent (>24hrs) due to surgical blood loss or risk of bleeding:  yes

## 2016-03-30 NOTE — Anesthesia Procedure Notes (Signed)
Procedure Name: Intubation Date/Time: 03/30/2016 2:53 PM Performed by: Lavell Luster Pre-anesthesia Checklist: Patient identified, Emergency Drugs available, Patient being monitored, Suction available and Timeout performed Patient Re-evaluated:Patient Re-evaluated prior to inductionOxygen Delivery Method: Circle system utilized Preoxygenation: Pre-oxygenation with 100% oxygen Intubation Type: IV induction Ventilation: Mask ventilation without difficulty Laryngoscope Size: Mac and 4 Grade View: Grade I Tube type: Oral Tube size: 7.5 mm Number of attempts: 1 Airway Equipment and Method: Stylet Placement Confirmation: ETT inserted through vocal cords under direct vision,  positive ETCO2 and breath sounds checked- equal and bilateral Secured at: 21 cm Tube secured with: Tape Dental Injury: Teeth and Oropharynx as per pre-operative assessment

## 2016-03-30 NOTE — Progress Notes (Signed)
Discharged instructions/educaton given to patient with wife at bedside and they both verbalized understanding. Patient moving and walking well, no c/o numbness or tingling on lowe extremities. Pain is low to moderate. Patient voiding well with no difficulty. Patient tolerated his dinner well. Patient d/c to home via wheelchair.

## 2016-03-30 NOTE — Discharge Summary (Signed)
  Physician Discharge Summary  Patient ID: AHMEIR STEPKA MRN: [id number] DOB/AGE: January 27, 1966 50 y.o.  Admit date: 03/30/2016 Discharge date: 03/30/2016  Admission Diagnoses:recurrent HNP L5/s1 left  Discharge Diagnoses: same Active Problems:   HNP (herniated nucleus pulposus), lumbar   Discharged Condition: good  Hospital Course: Mr. Defina was admitted and taken to the operating room for an uncomplicated redo discetomy at L5/S1 on the left. Post op his wound is clean, dry, and without signs of infection. He is moving his lower extremities well. He has voided, ambulated, and tolerated a regular diet.  Treatments: surgery: Redo discetomy L L5/S1  Discharge Exam: Blood pressure 140/85, pulse 73, temperature 97 F (36.1 C), temperature source Oral, resp. rate 24, height 6' (1.829 m), weight 107.502 kg (237 lb), SpO2 92 %. General appearance: alert, cooperative, appears stated age and no distress Neurologic: Alert and oriented X 3, normal strength and tone. Normal symmetric reflexes. Normal coordination and gait  Disposition:  Displacement of lumbosacral intervertebral disc    Medication List    TAKE these medications        CERAVE Oint  Apply 1 application topically daily as needed (dry skin).     diclofenac 75 MG EC tablet  Commonly known as:  VOLTAREN  Take 75 mg by mouth 2 (two) times daily as needed (pain/ inflammation).     oxybutynin 10 MG 24 hr tablet  Commonly known as:  DITROPAN-XL  Take 10 mg by mouth daily.     tamsulosin 0.4 MG Caps capsule  Commonly known as:  FLOMAX  Take 0.4 mg by mouth at bedtime.     tiZANidine 4 MG tablet  Commonly known as:  ZANAFLEX  Take 1 tablet (4 mg total) by mouth every 6 (six) hours as needed for muscle spasms.     traMADol 50 MG tablet  Commonly known as:  ULTRAM  Take 50 mg by mouth every 6 (six) hours as needed (pain).           Follow-up Information    Follow up with Mccoy Testa L, MD In 3 weeks.   Specialty:   Neurosurgery   Why:  call the office to make an appointment please   Contact information:   [address] [place] [address] [phone]       Signed: Winfield Cunas 03/30/2016, 5:58 PM

## 2016-03-30 NOTE — Transfer of Care (Signed)
Immediate Anesthesia Transfer of Care Note  Patient: Francisco Carpenter  Procedure(s) Performed: Procedure(s): Recurrent Lumbar five-sacral one Microdiskectomy (N/A)  Patient Location: PACU  Anesthesia Type:General  Level of Consciousness: awake, alert  and oriented  Airway & Oxygen Therapy: Patient connected to nasal cannula oxygen  Post-op Assessment: Report given to RN  Post vital signs: stable  Last Vitals:  Filed Vitals:   03/30/16 1159  BP: 118/74  Pulse: 58  Temp: 36.7 C  Resp: 20    Last Pain:  Filed Vitals:   03/30/16 1239  PainSc: 4       Patients Stated Pain Goal: 1 ([id number])  Complications: No apparent anesthesia complications

## 2016-03-30 NOTE — H&P (Signed)
BP 118/74 mmHg  Pulse 58  Temp(Src) 98 F (36.7 C) (Oral)  Resp 20  Ht 6' (1.829 m)  Wt 107.502 kg (237 lb)  BMI 32.14 kg/m2  SpO2 97%  Francisco Carpenter is a patient of mine whom I had taken to the operating room he says about ten to twelve years ago for what was most likely an L5-S1 displaced disc on the left side.  He says I did the operation and he did better, and after just some post ops, he has just returned today.  He says over the last few weeks, he has had some pain in the left lower extremity, but over the past few years he always has a twinge, but the pain would go away after a day or a few hours.  It is lasting longer now, and the occurrences are just more frequent.  He says he has gotten much worse over the last ten days.  He is unable to sleep, he cannot walk his dog, he refused to go on a walk with his wife this past Sunday.  He says standing may offer some temporary relief, but the pain will be present again after some time while he stands.  He has no discomfort on the right side.  He has no bladder or bowel dysfunction.  He is right-handed.  He is 50 years of age.     REVIEW OF SYSTEMS:                        Review of systems is positive for tinnitus, leg pain with walking, and leg pain at rest.  Full review of systems was performed and noted in my note today.  He does have tinnitus which is ongoing, and he says he has a current leg problem.     CURRENT MEDICAL PROBLEMS:         He describes the pain as excruciating in his left lower extremity.  Numbness in his left foot.     PAST SURGICAL HISTORY:                  The operation for his back was 2005.     CURRENT MEDICATIONS:                     He takes Oxybutynin and Tolterodine.     FAMILY HISTORY:                                Mother is 53.  Father is 54.  Both are in good health.     SOCIAL HISTORY:                                He does not use alcohol.  He does not have a history of substance abuse nor does not  smoke.     PHYSICAL EXAMINATION:                    Vital signs height 5 feet 11.5 inches, weight 243.8 pounds, blood pressure is 121/80, pulse is 60, temperature is 98.1, respiratory rate is 14.  Pain is 7/10.     On examination he is alert, oriented x4, and answers all questions appropriately.  Memory, language, attention span, and fund of knowledge is normal.  Speech is clear, it is also fluent.  Tongue and uvula in the midline.  Shoulder shrug is normal.  Hearing is intact to voice.  Uvula elevates in midline.  Shoulder shrug is normal.  Romberg test is negative.     Francisco Carpenter returned today with the MRI and plain x-rays of the lumbar spine.  The MRI shows a recurring disc herniation at L5-S1 eccentric to the left side.  I do believe this is the reason for the pain that he is experiencing in his back and left lower extremity.       IMPRESSION/PLAN:                             I have offered, and he has agreed, to undergo an operative decompression of the S1 nerve root.  He has full understanding of the procedure having been through this approximately eight years ago at the same level. Risks and benefits of bleeding, infection, bowel or bladder dysfunction, need for further surgery, and recurrent surgery were all discussed.

## 2016-03-30 NOTE — Anesthesia Preprocedure Evaluation (Addendum)
Anesthesia Evaluation  Patient identified by MRN, date of birth, ID band Patient awake    Reviewed: Allergy & Precautions, NPO status , Patient's Chart, lab work & pertinent test results  Airway Mallampati: II  TM Distance: >3 FB Neck ROM: Full    Dental  (+) Dental Advisory Given   Pulmonary neg pulmonary ROS,    breath sounds clear to auscultation       Cardiovascular negative cardio ROS   Rhythm:Regular Rate:Normal     Neuro/Psych negative neurological ROS     GI/Hepatic negative GI ROS, Neg liver ROS,   Endo/Other  negative endocrine ROS  Renal/GU negative Renal ROS     Musculoskeletal   Abdominal   Peds  Hematology negative hematology ROS (+)   Anesthesia Other Findings   Reproductive/Obstetrics                            Lab Results  Component Value Date   WBC 5.2 03/30/2016   HGB 13.6 03/30/2016   HCT 40.3 03/30/2016   MCV 85.2 03/30/2016   PLT 230 03/30/2016   Lab Results  Component Value Date   CREATININE 0.81 03/30/2016   BUN 18 03/30/2016   NA 141 03/30/2016   K 4.1 03/30/2016   CL 110 03/30/2016   CO2 23 03/30/2016    Anesthesia Physical Anesthesia Plan  ASA: II  Anesthesia Plan: General   Post-op Pain Management:    Induction: Intravenous  Airway Management Planned: Oral ETT  Additional Equipment:   Intra-op Plan:   Post-operative Plan: Extubation in OR  Informed Consent: I have reviewed the patients History and Physical, chart, labs and discussed the procedure including the risks, benefits and alternatives for the proposed anesthesia with the patient or authorized representative who has indicated his/her understanding and acceptance.   Dental advisory given  Plan Discussed with: CRNA  Anesthesia Plan Comments:         Anesthesia Quick Evaluation

## 2016-03-30 NOTE — Discharge Instructions (Signed)
Lumbar Discectomy °Care After °A discectomy involves removal of discmaterial (the cartilage-like structures located between the bones of the back). It is done to relieve pressure on nerve roots. It can be used as a treatment for a back problem. The time in surgery depends on the findings in surgery and what is necessary to correct the problems. °HOME CARE INSTRUCTIONS  °· Check the cut (incision) made by the surgeon twice a day for signs of infection. Some signs of infection may include:  °· A foul smelling, greenish or yellowish discharge from the wound.  °· Increased pain.  °· Increased redness over the incision (operative) site.  °· The skin edges may separate.  °· Flu-like symptoms (problems).  °· A temperature above 101.5° F (38.6° C).  °· Change your bandages in about 24 to 36 hours following surgery or as directed.  °· You may shower tomrrow.  Avoid bathtubs, swimming pools and hot tubs for three weeks or until your incision has healed completely. °· Follow your doctor's instructions as to safe activities, exercises, and physical therapy.  °· Weight reduction may be beneficial if you are overweight.  °· Daily exercise is helpful to prevent the return of problems. Walking is permitted. You may use a treadmill without an incline. Cut down on activities and exercise if you have discomfort. You may also go up and down stairs as much as you can tolerate.  °· DO NOT lift anything heavier than 10 to 15 lbs. Avoid bending or twisting at the waist. Always bend your knees when lifting.  °· Maintain strength and range of motion as instructed.  °· Do not drive for 10 days, or as directed by your doctors. You may be a passenger . Lying back in the passenger seat may be more comfortable for you. Always wear a seatbelt.  °· Limit your sitting in a regular chair to 20 to 30 minutes at a time. There are no limitations for sitting in a recliner. You should lie down or walk in between sitting periods.  °· Only take  over-the-counter or prescription medicines for pain, discomfort, or fever as directed by your caregiver.  °SEEK MEDICAL CARE IF:  °· There is increased bleeding (more than a small spot) from the wound.  °· You notice redness, swelling, or increasing pain in the wound.  °· Pus is coming from wound.  °· You develop an unexplained oral temperature above 102° F (38.9° C) develops.  °· You notice a foul smell coming from the wound or dressing.  °· You have increasing pain in your wound.  °SEEK IMMEDIATE MEDICAL CARE IF:  °· You develop a rash.  °· You have difficulty breathing.  °· You develop any allergic problems to medicines given.  °Document Released: 08/31/2004 Document Revised: 09/15/2011 Document Reviewed: 12/20/2007 °ExitCare® Patient Information °

## 2016-03-31 ENCOUNTER — Encounter (HOSPITAL_COMMUNITY): Payer: Self-pay | Admitting: Neurosurgery

## 2016-03-31 NOTE — Anesthesia Postprocedure Evaluation (Signed)
Anesthesia Post Note  Patient: Francisco Carpenter  Procedure(s) Performed: Procedure(s) (LRB): Recurrent Lumbar five-sacral one Microdiskectomy (N/A)  Patient location during evaluation: PACU Anesthesia Type: General Level of consciousness: awake and alert Pain management: pain level controlled Vital Signs Assessment: post-procedure vital signs reviewed and stable Respiratory status: spontaneous breathing, nonlabored ventilation, respiratory function stable and patient connected to nasal cannula oxygen Cardiovascular status: blood pressure returned to baseline and stable Postop Assessment: no signs of nausea or vomiting Anesthetic complications: no    Last Vitals:  Filed Vitals:   03/30/16 1830 03/30/16 2000  BP: 122/83 131/79  Pulse: 50 62  Temp: 36.6 C 36.5 C  Resp: 10 22    Last Pain:  Filed Vitals:   03/30/16 2042  PainSc: 3                  Tiajuana Amass

## 2016-11-23 ENCOUNTER — Emergency Department (HOSPITAL_COMMUNITY)
Admission: EM | Admit: 2016-11-23 | Discharge: 2016-11-23 | Disposition: A | Discharge status: Home / Self Care | Payer: BLUE CROSS/BLUE SHIELD | Attending: Emergency Medicine | Admitting: Emergency Medicine

## 2016-11-23 ENCOUNTER — Encounter (HOSPITAL_COMMUNITY): Payer: Self-pay | Admitting: Emergency Medicine

## 2016-11-23 DIAGNOSIS — M545 Low back pain, unspecified: Secondary | ICD-10-CM

## 2016-11-23 MED ORDER — PREDNISONE 10 MG (21) PO TBPK: 10.0000 mg | ORAL_TABLET | Freq: Every day | ORAL | 0 refills | Status: DC

## 2016-11-23 MED ORDER — DICLOFENAC SODIUM 75 MG PO TBEC: 75.0000 mg | DELAYED_RELEASE_TABLET | Freq: Two times a day (BID) | ORAL | 0 refills | Status: DC | PRN

## 2016-11-23 MED ORDER — HYDROCODONE-ACETAMINOPHEN 5-325 MG PO TABS: 2.0000 | ORAL_TABLET | ORAL | 0 refills | Status: DC | PRN
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
## 2016-11-23 NOTE — Discharge Instructions (Signed)
Schedule to see Dr. Cyndy Freeze for recheck.

## 2016-11-23 NOTE — ED Triage Notes (Signed)
Pt c/o pain in right lower back onset yesterday.  Pt radiating down right leh.

## 2017-05-15 ENCOUNTER — Encounter: Payer: Self-pay | Admitting: Neurology

## 2017-06-08 ENCOUNTER — Encounter: Payer: BLUE CROSS/BLUE SHIELD | Admitting: Neurology

## 2017-06-22 ENCOUNTER — Other Ambulatory Visit: Payer: Self-pay | Admitting: Orthopedic Surgery

## 2017-07-07 ENCOUNTER — Ambulatory Visit (HOSPITAL_COMMUNITY)
Admission: RE | Admit: 2017-07-07 | Discharge: 2017-07-07 | Disposition: A | Discharge status: Home / Self Care | Payer: BLUE CROSS/BLUE SHIELD | Source: Ambulatory Visit | Attending: Orthopedic Surgery | Admitting: Orthopedic Surgery

## 2017-07-07 ENCOUNTER — Encounter (HOSPITAL_COMMUNITY): Payer: Self-pay

## 2017-07-07 ENCOUNTER — Encounter (HOSPITAL_COMMUNITY)
Admission: RE | Admit: 2017-07-07 | Discharge: 2017-07-07 | Disposition: A | Discharge status: Home / Self Care | Payer: BLUE CROSS/BLUE SHIELD | Source: Ambulatory Visit | Attending: Orthopedic Surgery | Admitting: Orthopedic Surgery

## 2017-07-07 DIAGNOSIS — Z0183 Encounter for blood typing: Secondary | ICD-10-CM | POA: Insufficient documentation

## 2017-07-07 DIAGNOSIS — I498 Other specified cardiac arrhythmias: Secondary | ICD-10-CM | POA: Insufficient documentation

## 2017-07-07 DIAGNOSIS — J9811 Atelectasis: Secondary | ICD-10-CM | POA: Diagnosis not present

## 2017-07-07 DIAGNOSIS — Z01812 Encounter for preprocedural laboratory examination: Secondary | ICD-10-CM | POA: Insufficient documentation

## 2017-07-07 DIAGNOSIS — Z01818 Encounter for other preprocedural examination: Secondary | ICD-10-CM

## 2017-07-07 LAB — CBC WITH DIFFERENTIAL/PLATELET
BASOS ABS: 0 10*3/uL (ref 0.0–0.1)
Basophils Relative: 0 %
EOS ABS: 0.1 10*3/uL (ref 0.0–0.7)
Eosinophils Relative: 2 %
HCT: 39.8 % (ref 39.0–52.0)
Hemoglobin: 13.5 g/dL (ref 13.0–17.0)
LYMPHS ABS: 1.6 10*3/uL (ref 0.7–4.0)
LYMPHS PCT: 29 %
MCH: 29.6 pg (ref 26.0–34.0)
MCHC: 33.9 g/dL (ref 30.0–36.0)
MCV: 87.3 fL (ref 78.0–100.0)
MONO ABS: 0.7 10*3/uL (ref 0.1–1.0)
Monocytes Relative: 14 %
Neutro Abs: 3 10*3/uL (ref 1.7–7.7)
Neutrophils Relative %: 55 %
Platelets: 240 10*3/uL (ref 150–400)
RBC: 4.56 MIL/uL (ref 4.22–5.81)
RDW: 12.9 % (ref 11.5–15.5)
WBC: 5.4 10*3/uL (ref 4.0–10.5)

## 2017-07-07 LAB — URINALYSIS, ROUTINE W REFLEX MICROSCOPIC
BILIRUBIN URINE: NEGATIVE
GLUCOSE, UA: NEGATIVE mg/dL
Hgb urine dipstick: NEGATIVE
KETONES UR: NEGATIVE mg/dL
Leukocytes, UA: NEGATIVE
Nitrite: NEGATIVE
PH: 7 (ref 5.0–8.0)
PROTEIN: NEGATIVE mg/dL
Specific Gravity, Urine: 1.023 (ref 1.005–1.030)

## 2017-07-07 LAB — COMPREHENSIVE METABOLIC PANEL
ALT: 30 U/L (ref 17–63)
AST: 26 U/L (ref 15–41)
Albumin: 4.4 g/dL (ref 3.5–5.0)
Alkaline Phosphatase: 50 U/L (ref 38–126)
Anion gap: 7 (ref 5–15)
BILIRUBIN TOTAL: 0.6 mg/dL (ref 0.3–1.2)
BUN: 12 mg/dL (ref 6–20)
CO2: 24 mmol/L (ref 22–32)
CREATININE: 1.11 mg/dL (ref 0.61–1.24)
Calcium: 9 mg/dL (ref 8.9–10.3)
Chloride: 108 mmol/L (ref 101–111)
Glucose, Bld: 104 mg/dL — ABNORMAL HIGH (ref 65–99)
Potassium: 3.9 mmol/L (ref 3.5–5.1)
Sodium: 139 mmol/L (ref 135–145)
TOTAL PROTEIN: 6.6 g/dL (ref 6.5–8.1)

## 2017-07-07 LAB — SURGICAL PCR SCREEN
MRSA, PCR: NEGATIVE
STAPHYLOCOCCUS AUREUS: POSITIVE — AB

## 2017-07-07 LAB — PROTIME-INR
INR: 0.93
PROTHROMBIN TIME: 12.4 s (ref 11.4–15.2)

## 2017-07-07 LAB — APTT: APTT: 30 s (ref 24–36)

## 2017-07-07 NOTE — Pre-Procedure Instructions (Signed)
Francisco Carpenter  07/07/2017    Your procedure is scheduled on Thursday, October 4  Report to [facility] Admitting at 5:30 AM                      Your surgery or procedure is scheduled for 7:30 A.M.   Call this number if you have problems the morning of surgery: [phone]- pre op desk    Remember:  Do not eat food or drink liquids after midnight Wednesday, October 3.  Take these medicines the morning of surgery with A SIP OF WATER: none               STOP taking Aspirin, Aspirin Products (Goody Powder, Excedrin Migraine), Ibuprofen (Advil), Naproxen (Aleve), Vitamins and Herbal Products (ie Fish Oil) Special instructions:   [name]- Preparing For Surgery  Before surgery, you can play an important role. Because skin is not sterile, your skin needs to be as free of germs as possible. You can reduce the number of germs on your skin by washing with CHG (chlorahexidine gluconate) Soap before surgery.  CHG is an antiseptic cleaner which kills germs and bonds with the skin to continue killing germs even after washing.  Please do not use if you have an allergy to CHG or antibacterial soaps. If your skin becomes reddened/irritated stop using the CHG.  Do not shave (including legs and underarms) for at least 48 hours prior to first CHG shower. It is OK to shave your face.  Please follow these instructions carefully.   1. Shower the NIGHT BEFORE SURGERY and the MORNING OF SURGERY with CHG.   2. If you chose to wash your hair, wash your hair first as usual with your normal shampoo.  3. After you shampoo, rinse your hair and body thoroughly to remove the shampoo.            Wash your face and private area with the soap you use at home, then rinse.  4. Use CHG as you would any other liquid soap. You can apply CHG directly to the skin and wash gently with a scrungie or a clean washcloth.   5. Apply the CHG Soap to your body ONLY FROM THE NECK DOWN.  Do not use on open wounds  or open sores. Avoid contact with your eyes, ears, mouth and genitals (private parts). Wash genitals (private parts) with your normal soap.  6. Wash thoroughly, paying special attention to the area where your surgery will be performed.  7. Thoroughly rinse your body with warm water from the neck down.  8. DO NOT shower/wash with your normal soap after using and rinsing off the CHG Soap.  9. Pat yourself dry with a CLEAN TOWEL.   10. Wear CLEAN PAJAMAS   11. Place CLEAN SHEETS on your bed the night of your first shower and DO NOT SLEEP WITH PETS.  Day of Surgery: Shower as above Do not apply any deodorants/lotions, powders, colognes. Please wear clean clothes to the hospital/surgery center.    Do not wear jewelry, make-up or nail polish.  Do not shave 48 hours prior to surgery.  Men may shave face and neck.  Do not bring valuables to the hospital.  [facility] is not responsible for any belongings or valuables.  Contacts, dentures or bridgework may not be worn into surgery.  Leave your suitcase in the car.  After surgery it may be brought to your room.  For  patients admitted to the hospital, discharge time will be determined by your treatment team.  Patients discharged the day of surgery will not be allowed to drive home.  Please read over the following fact sheets that you were given: Pain Booklet, Patient Instructions for Mupirocin Application, Incentive Spirometry, Surgical Site Infections.

## 2017-07-08 ENCOUNTER — Telehealth (HOSPITAL_COMMUNITY): Payer: Self-pay | Admitting: *Deleted

## 2017-07-12 MED ORDER — CEFAZOLIN SODIUM-DEXTROSE 2-4 GM/100ML-% IV SOLN
2.0000 g | INTRAVENOUS | Status: AC
  Administered 2017-07-13: 2 g via INTRAVENOUS
  Filled 2017-07-12: qty 100

## 2017-07-13 ENCOUNTER — Ambulatory Visit (HOSPITAL_COMMUNITY): Payer: BLUE CROSS/BLUE SHIELD

## 2017-07-13 ENCOUNTER — Encounter (HOSPITAL_COMMUNITY)
Admission: RE | Disposition: A | Discharge status: Home / Self Care | Payer: Self-pay | Source: Ambulatory Visit | Attending: Orthopedic Surgery

## 2017-07-13 ENCOUNTER — Ambulatory Visit (HOSPITAL_COMMUNITY): Payer: BLUE CROSS/BLUE SHIELD | Admitting: Certified Registered"

## 2017-07-13 ENCOUNTER — Encounter (HOSPITAL_COMMUNITY): Payer: Self-pay | Admitting: *Deleted

## 2017-07-13 ENCOUNTER — Observation Stay (HOSPITAL_COMMUNITY)
Admission: RE | Admit: 2017-07-13 | Discharge: 2017-07-14 | Disposition: A | Discharge status: Home / Self Care | Payer: BLUE CROSS/BLUE SHIELD | Source: Ambulatory Visit | Attending: Orthopedic Surgery | Admitting: Orthopedic Surgery

## 2017-07-13 DIAGNOSIS — M4802 Spinal stenosis, cervical region: Secondary | ICD-10-CM | POA: Insufficient documentation

## 2017-07-13 DIAGNOSIS — G9529 Other cord compression: Secondary | ICD-10-CM | POA: Diagnosis not present

## 2017-07-13 DIAGNOSIS — N3281 Overactive bladder: Secondary | ICD-10-CM | POA: Insufficient documentation

## 2017-07-13 DIAGNOSIS — Z419 Encounter for procedure for purposes other than remedying health state, unspecified: Secondary | ICD-10-CM

## 2017-07-13 DIAGNOSIS — M5412 Radiculopathy, cervical region: Principal | ICD-10-CM | POA: Insufficient documentation

## 2017-07-13 DIAGNOSIS — M4803 Spinal stenosis, cervicothoracic region: Secondary | ICD-10-CM | POA: Insufficient documentation

## 2017-07-13 DIAGNOSIS — R531 Weakness: Secondary | ICD-10-CM | POA: Diagnosis not present

## 2017-07-13 DIAGNOSIS — Z79899 Other long term (current) drug therapy: Secondary | ICD-10-CM | POA: Diagnosis not present

## 2017-07-13 DIAGNOSIS — M5410 Radiculopathy, site unspecified: Secondary | ICD-10-CM | POA: Diagnosis present

## 2017-07-13 HISTORY — PX: ANTERIOR CERVICAL DECOMP/DISCECTOMY FUSION: SHX1161

## 2017-07-13 SURGERY — ANTERIOR CERVICAL DECOMPRESSION/DISCECTOMY FUSION 3 LEVELS
Anesthesia: General

## 2017-07-13 MED ORDER — PROPOFOL 10 MG/ML IV BOLUS
INTRAVENOUS | Status: DC | PRN
  Administered 2017-07-13: 160 mg via INTRAVENOUS

## 2017-07-13 MED ORDER — BUPIVACAINE-EPINEPHRINE 0.25% -1:200000 IJ SOLN
INTRAMUSCULAR | Status: DC | PRN
  Administered 2017-07-13: 30 mL

## 2017-07-13 MED ORDER — ZOLPIDEM TARTRATE 5 MG PO TABS: 5.0000 mg | ORAL_TABLET | Freq: Every evening | ORAL | Status: DC | PRN

## 2017-07-13 MED ORDER — BISACODYL 5 MG PO TBEC: 5.0000 mg | DELAYED_RELEASE_TABLET | Freq: Every day | ORAL | Status: DC | PRN

## 2017-07-13 MED ORDER — HYDROMORPHONE HCL 1 MG/ML IJ SOLN
INTRAMUSCULAR | Status: AC
  Administered 2017-07-13: 0.5 mg via INTRAVENOUS
  Filled 2017-07-13: qty 1

## 2017-07-13 MED ORDER — THROMBIN 20000 UNITS EX SOLR
CUTANEOUS | Status: AC
  Filled 2017-07-13: qty 20000

## 2017-07-13 MED ORDER — COENZYME Q10 200 MG PO CAPS: 200.0000 mg | ORAL_CAPSULE | Freq: Every day | ORAL | Status: DC

## 2017-07-13 MED ORDER — ONDANSETRON HCL 4 MG/2ML IJ SOLN
INTRAMUSCULAR | Status: AC
  Filled 2017-07-13: qty 2

## 2017-07-13 MED ORDER — SODIUM CHLORIDE 0.9 % IV SOLN: 250.0000 mL | INTRAVENOUS | Status: DC

## 2017-07-13 MED ORDER — PHENOL 1.4 % MT LIQD
1.0000 | OROMUCOSAL | Status: DC | PRN
  Administered 2017-07-13: 1 via OROMUCOSAL
  Filled 2017-07-13: qty 177

## 2017-07-13 MED ORDER — ROCURONIUM BROMIDE 10 MG/ML (PF) SYRINGE
PREFILLED_SYRINGE | INTRAVENOUS | Status: DC | PRN
  Administered 2017-07-13: 20 mg via INTRAVENOUS
  Administered 2017-07-13: 50 mg via INTRAVENOUS
  Administered 2017-07-13: 30 mg via INTRAVENOUS

## 2017-07-13 MED ORDER — THROMBIN 20000 UNITS EX SOLR
CUTANEOUS | Status: DC | PRN
  Administered 2017-07-13: 20 mL via TOPICAL

## 2017-07-13 MED ORDER — ACETAMINOPHEN 325 MG PO TABS: 650.0000 mg | ORAL_TABLET | ORAL | Status: DC | PRN

## 2017-07-13 MED ORDER — OXYCODONE HCL 5 MG PO TABS: 5.0000 mg | ORAL_TABLET | Freq: Once | ORAL | Status: DC | PRN

## 2017-07-13 MED ORDER — HYDROMORPHONE HCL 1 MG/ML IJ SOLN
0.2500 mg | INTRAMUSCULAR | Status: DC | PRN
  Administered 2017-07-13 (×4): 0.5 mg via INTRAVENOUS

## 2017-07-13 MED ORDER — 0.9 % SODIUM CHLORIDE (POUR BTL) OPTIME
TOPICAL | Status: DC | PRN
  Administered 2017-07-13: 1000 mL

## 2017-07-13 MED ORDER — FENTANYL CITRATE (PF) 100 MCG/2ML IJ SOLN
INTRAMUSCULAR | Status: DC | PRN
  Administered 2017-07-13: 50 ug via INTRAVENOUS
  Administered 2017-07-13: 100 ug via INTRAVENOUS
  Administered 2017-07-13: 50 ug via INTRAVENOUS

## 2017-07-13 MED ORDER — ONDANSETRON HCL 4 MG/2ML IJ SOLN: 4.0000 mg | Freq: Four times a day (QID) | INTRAMUSCULAR | Status: DC | PRN

## 2017-07-13 MED ORDER — SODIUM CHLORIDE 0.9% FLUSH: 3.0000 mL | INTRAVENOUS | Status: DC | PRN

## 2017-07-13 MED ORDER — PHENYLEPHRINE HCL 10 MG/ML IJ SOLN
INTRAVENOUS | Status: DC | PRN
  Administered 2017-07-13: 20 ug/min via INTRAVENOUS

## 2017-07-13 MED ORDER — ROCURONIUM BROMIDE 10 MG/ML (PF) SYRINGE
PREFILLED_SYRINGE | INTRAVENOUS | Status: AC
  Filled 2017-07-13: qty 5

## 2017-07-13 MED ORDER — ONDANSETRON HCL 4 MG PO TABS: 4.0000 mg | ORAL_TABLET | Freq: Four times a day (QID) | ORAL | Status: DC | PRN

## 2017-07-13 MED ORDER — SODIUM CHLORIDE 0.9% FLUSH
3.0000 mL | Freq: Two times a day (BID) | INTRAVENOUS | Status: DC
  Administered 2017-07-13: 3 mL via INTRAVENOUS

## 2017-07-13 MED ORDER — PHENYLEPHRINE 40 MCG/ML (10ML) SYRINGE FOR IV PUSH (FOR BLOOD PRESSURE SUPPORT)
PREFILLED_SYRINGE | INTRAVENOUS | Status: AC
  Filled 2017-07-13: qty 10

## 2017-07-13 MED ORDER — MIDAZOLAM HCL 2 MG/2ML IJ SOLN
INTRAMUSCULAR | Status: AC
  Filled 2017-07-13: qty 2

## 2017-07-13 MED ORDER — PHENYLEPHRINE 40 MCG/ML (10ML) SYRINGE FOR IV PUSH (FOR BLOOD PRESSURE SUPPORT)
PREFILLED_SYRINGE | INTRAVENOUS | Status: DC | PRN
  Administered 2017-07-13: 120 ug via INTRAVENOUS
  Administered 2017-07-13: 80 ug via INTRAVENOUS
  Administered 2017-07-13: 120 ug via INTRAVENOUS
  Administered 2017-07-13: 80 ug via INTRAVENOUS

## 2017-07-13 MED ORDER — ONDANSETRON HCL 4 MG/2ML IJ SOLN
INTRAMUSCULAR | Status: DC | PRN
  Administered 2017-07-13: 4 mg via INTRAVENOUS

## 2017-07-13 MED ORDER — OXYCODONE HCL 5 MG/5ML PO SOLN: 5.0000 mg | Freq: Once | ORAL | Status: DC | PRN

## 2017-07-13 MED ORDER — ACETAMINOPHEN 650 MG RE SUPP: 650.0000 mg | RECTAL | Status: DC | PRN

## 2017-07-13 MED ORDER — GLUCOSAMINE-VITAMIN D 1000-200 MG-UNIT PO TABS: 1.0000 | ORAL_TABLET | Freq: Every day | ORAL | Status: DC

## 2017-07-13 MED ORDER — FLEET ENEMA 7-19 GM/118ML RE ENEM: 1.0000 | ENEMA | Freq: Once | RECTAL | Status: DC | PRN

## 2017-07-13 MED ORDER — LACTATED RINGERS IV SOLN
INTRAVENOUS | Status: DC | PRN
  Administered 2017-07-13 (×2): via INTRAVENOUS

## 2017-07-13 MED ORDER — CEFAZOLIN SODIUM-DEXTROSE 2-4 GM/100ML-% IV SOLN
2.0000 g | Freq: Three times a day (TID) | INTRAVENOUS | Status: AC
  Administered 2017-07-13 (×2): 2 g via INTRAVENOUS
  Filled 2017-07-13 (×2): qty 100

## 2017-07-13 MED ORDER — DOCUSATE SODIUM 100 MG PO CAPS
100.0000 mg | ORAL_CAPSULE | Freq: Two times a day (BID) | ORAL | Status: DC
  Administered 2017-07-13 – 2017-07-14 (×3): 100 mg via ORAL
  Filled 2017-07-13 (×3): qty 1

## 2017-07-13 MED ORDER — PROMETHAZINE HCL 25 MG/ML IJ SOLN: 6.2500 mg | INTRAMUSCULAR | Status: DC | PRN

## 2017-07-13 MED ORDER — MEPERIDINE HCL 25 MG/ML IJ SOLN: 6.2500 mg | INTRAMUSCULAR | Status: DC | PRN

## 2017-07-13 MED ORDER — MIDAZOLAM HCL 2 MG/2ML IJ SOLN
INTRAMUSCULAR | Status: DC | PRN
  Administered 2017-07-13: 2 mg via INTRAVENOUS

## 2017-07-13 MED ORDER — SENNOSIDES-DOCUSATE SODIUM 8.6-50 MG PO TABS: 1.0000 | ORAL_TABLET | Freq: Every evening | ORAL | Status: DC | PRN

## 2017-07-13 MED ORDER — TAMSULOSIN HCL 0.4 MG PO CAPS
0.4000 mg | ORAL_CAPSULE | Freq: Every day | ORAL | Status: DC
  Administered 2017-07-13: 0.4 mg via ORAL
  Filled 2017-07-13: qty 1

## 2017-07-13 MED ORDER — OXYBUTYNIN CHLORIDE ER 10 MG PO TB24
10.0000 mg | ORAL_TABLET | Freq: Every day | ORAL | Status: DC
  Administered 2017-07-13 – 2017-07-14 (×2): 10 mg via ORAL
  Filled 2017-07-13 (×2): qty 1

## 2017-07-13 MED ORDER — SUGAMMADEX SODIUM 200 MG/2ML IV SOLN
INTRAVENOUS | Status: DC | PRN
  Administered 2017-07-13: 200 mg via INTRAVENOUS

## 2017-07-13 MED ORDER — SUGAMMADEX SODIUM 200 MG/2ML IV SOLN
INTRAVENOUS | Status: AC
  Filled 2017-07-13: qty 2

## 2017-07-13 MED ORDER — DIAZEPAM 5 MG PO TABS
5.0000 mg | ORAL_TABLET | Freq: Four times a day (QID) | ORAL | Status: DC | PRN
  Administered 2017-07-13 – 2017-07-14 (×3): 5 mg via ORAL
  Filled 2017-07-13 (×3): qty 1

## 2017-07-13 MED ORDER — ADULT MULTIVITAMIN W/MINERALS CH
1.0000 | ORAL_TABLET | Freq: Every day | ORAL | Status: DC
  Administered 2017-07-14: 1 via ORAL
  Filled 2017-07-13: qty 1

## 2017-07-13 MED ORDER — POVIDONE-IODINE 7.5 % EX SOLN: Freq: Once | CUTANEOUS | Status: DC

## 2017-07-13 MED ORDER — OXYCODONE-ACETAMINOPHEN 5-325 MG PO TABS
1.0000 | ORAL_TABLET | ORAL | Status: DC | PRN
  Administered 2017-07-13 – 2017-07-14 (×5): 2 via ORAL
  Filled 2017-07-13 (×5): qty 2

## 2017-07-13 MED ORDER — BUPIVACAINE-EPINEPHRINE (PF) 0.5% -1:200000 IJ SOLN
INTRAMUSCULAR | Status: AC
  Filled 2017-07-13: qty 30

## 2017-07-13 MED ORDER — FENTANYL CITRATE (PF) 250 MCG/5ML IJ SOLN
INTRAMUSCULAR | Status: AC
  Filled 2017-07-13: qty 5

## 2017-07-13 MED ORDER — LIDOCAINE 2% (20 MG/ML) 5 ML SYRINGE
INTRAMUSCULAR | Status: AC
  Filled 2017-07-13: qty 5

## 2017-07-13 MED ORDER — PANTOPRAZOLE SODIUM 40 MG IV SOLR
40.0000 mg | Freq: Every day | INTRAVENOUS | Status: DC
  Administered 2017-07-13: 40 mg via INTRAVENOUS
  Filled 2017-07-13: qty 40

## 2017-07-13 MED ORDER — MENTHOL 3 MG MT LOZG: 1.0000 | LOZENGE | OROMUCOSAL | Status: DC | PRN

## 2017-07-13 MED ORDER — PROPOFOL 10 MG/ML IV BOLUS
INTRAVENOUS | Status: AC
  Filled 2017-07-13: qty 20

## 2017-07-13 MED ORDER — LIDOCAINE 2% (20 MG/ML) 5 ML SYRINGE
INTRAMUSCULAR | Status: DC | PRN
  Administered 2017-07-13: 50 mg via INTRAVENOUS

## 2017-07-13 MED ORDER — ALUM & MAG HYDROXIDE-SIMETH 200-200-20 MG/5ML PO SUSP: 30.0000 mL | Freq: Four times a day (QID) | ORAL | Status: DC | PRN

## 2017-07-13 SURGICAL SUPPLY — 82 items
APL SKNCLS STERI-STRIP NONHPOA (GAUZE/BANDAGES/DRESSINGS) ×1
BENZOIN TINCTURE PRP APPL 2/3 (GAUZE/BANDAGES/DRESSINGS) ×3 IMPLANT
BIT DRILL NEURO 2X3.1 SFT TUCH (MISCELLANEOUS) ×1 IMPLANT
BIT DRILL SRG 14X2.2XFLT CHK (BIT) IMPLANT
BIT DRL SRG 14X2.2XFLT CHK (BIT) ×1
BLADE CLIPPER SURG (BLADE) ×1 IMPLANT
BLADE SURG 15 STRL LF DISP TIS (BLADE) ×1 IMPLANT
BLADE SURG 15 STRL SS (BLADE) ×3
BONE VIVIGEN FORMABLE 1.3CC (Bone Implant) ×6 IMPLANT
CANISTER SUCT 3000ML PPV (MISCELLANEOUS) ×3 IMPLANT
CARTRIDGE OIL MAESTRO DRILL (MISCELLANEOUS) ×1 IMPLANT
CLOSURE STERI-STRIP 1/2X4 (GAUZE/BANDAGES/DRESSINGS) ×1
CLOSURE WOUND 1/2 X4 (GAUZE/BANDAGES/DRESSINGS) ×1
CLSR STERI-STRIP ANTIMIC 1/2X4 (GAUZE/BANDAGES/DRESSINGS) ×1 IMPLANT
COVER SURGICAL LIGHT HANDLE (MISCELLANEOUS) ×3 IMPLANT
CRADLE DONUT ADULT HEAD (MISCELLANEOUS) ×3 IMPLANT
DECANTER SPIKE VIAL GLASS SM (MISCELLANEOUS) ×2 IMPLANT
DEVICE ENDSKLTN MED 6 7MM (Orthopedic Implant) IMPLANT
DIFFUSER DRILL AIR PNEUMATIC (MISCELLANEOUS) ×3 IMPLANT
DRAIN JACKSON RD 7FR 3/32 (WOUND CARE) IMPLANT
DRAPE C-ARM 42X72 X-RAY (DRAPES) ×3 IMPLANT
DRAPE POUCH INSTRU U-SHP 10X18 (DRAPES) ×3 IMPLANT
DRAPE SURG 17X23 STRL (DRAPES) ×9 IMPLANT
DRILL BIT SKYLINE 14MM (BIT) ×3
DRILL NEURO 2X3.1 SOFT TOUCH (MISCELLANEOUS) ×3
DURAPREP 6ML APPLICATOR 50/CS (WOUND CARE) ×3 IMPLANT
ELECT COATED BLADE 2.86 ST (ELECTRODE) ×3 IMPLANT
ELECT REM PT RETURN 9FT ADLT (ELECTROSURGICAL) ×3
ELECTRODE REM PT RTRN 9FT ADLT (ELECTROSURGICAL) ×1 IMPLANT
ENDOSKELETON MED 6 7MM (Orthopedic Implant) ×3 IMPLANT
EVACUATOR SILICONE 100CC (DRAIN) IMPLANT
GAUZE SPONGE 4X4 12PLY STRL (GAUZE/BANDAGES/DRESSINGS) ×3 IMPLANT
GAUZE SPONGE 4X4 16PLY XRAY LF (GAUZE/BANDAGES/DRESSINGS) ×3 IMPLANT
GLOVE BIO SURGEON STRL SZ7 (GLOVE) ×3 IMPLANT
GLOVE BIO SURGEON STRL SZ8 (GLOVE) ×3 IMPLANT
GLOVE BIOGEL PI IND STRL 7.0 (GLOVE) ×1 IMPLANT
GLOVE BIOGEL PI IND STRL 8 (GLOVE) ×1 IMPLANT
GLOVE BIOGEL PI INDICATOR 7.0 (GLOVE) ×2
GLOVE BIOGEL PI INDICATOR 8 (GLOVE) ×2
GOWN STRL REUS W/ TWL LRG LVL3 (GOWN DISPOSABLE) ×1 IMPLANT
GOWN STRL REUS W/ TWL XL LVL3 (GOWN DISPOSABLE) ×1 IMPLANT
GOWN STRL REUS W/TWL LRG LVL3 (GOWN DISPOSABLE) ×3
GOWN STRL REUS W/TWL XL LVL3 (GOWN DISPOSABLE) ×3
GRAFT BNE MATRIX VG FRMBL SM 1 (Bone Implant) IMPLANT
INTERLOCK LRDTC CRVCL VBR 6MM (Bone Implant) IMPLANT
INTERLOCK LRDTC CRVCL VBR 7MM (Bone Implant) IMPLANT
IV CATH 14GX2 1/4 (CATHETERS) ×3 IMPLANT
KIT BASIN OR (CUSTOM PROCEDURE TRAY) ×3 IMPLANT
KIT ROOM TURNOVER OR (KITS) ×3 IMPLANT
LORDOTIC CERVICAL VBR 6MM SM (Bone Implant) ×3 IMPLANT
LORDOTIC CERVICAL VBR 7MM SM (Bone Implant) ×3 IMPLANT
MARKER SKIN DUAL TIP RULER LAB (MISCELLANEOUS) ×2 IMPLANT
NDL PRECISIONGLIDE 27X1.5 (NEEDLE) ×1 IMPLANT
NDL SPNL 20GX3.5 QUINCKE YW (NEEDLE) ×1 IMPLANT
NEEDLE PRECISIONGLIDE 27X1.5 (NEEDLE) ×3 IMPLANT
NEEDLE SPNL 20GX3.5 QUINCKE YW (NEEDLE) ×3 IMPLANT
NS IRRIG 1000ML POUR BTL (IV SOLUTION) ×3 IMPLANT
OIL CARTRIDGE MAESTRO DRILL (MISCELLANEOUS) ×3
PACK ORTHO CERVICAL (CUSTOM PROCEDURE TRAY) ×3 IMPLANT
PAD ARMBOARD 7.5X6 YLW CONV (MISCELLANEOUS) ×3 IMPLANT
PATTIES SURGICAL .5 X.5 (GAUZE/BANDAGES/DRESSINGS) IMPLANT
PATTIES SURGICAL .5 X1 (DISPOSABLE) IMPLANT
PIN DISTRACTION 14 (PIN) ×4 IMPLANT
PLATE SKYLINE 3 LVL 48MM (Plate) ×2 IMPLANT
SCREW SKYLINE VAR OS 14MM (Screw) ×16 IMPLANT
SPONGE INTESTINAL PEANUT (DISPOSABLE) ×5 IMPLANT
SPONGE SURGIFOAM ABS GEL 100 (HEMOSTASIS) IMPLANT
STRIP CLOSURE SKIN 1/2X4 (GAUZE/BANDAGES/DRESSINGS) ×2 IMPLANT
SURGIFLO W/THROMBIN 8M KIT (HEMOSTASIS) IMPLANT
SUT MNCRL AB 4-0 PS2 18 (SUTURE) ×3 IMPLANT
SUT SILK 4 0 (SUTURE)
SUT SILK 4-0 18XBRD TIE 12 (SUTURE) IMPLANT
SUT VIC AB 2-0 CT2 18 VCP726D (SUTURE) ×3 IMPLANT
SYR BULB IRRIGATION 50ML (SYRINGE) ×3 IMPLANT
SYR CONTROL 10ML LL (SYRINGE) ×5 IMPLANT
TAPE CLOTH 4X10 WHT NS (GAUZE/BANDAGES/DRESSINGS) ×3 IMPLANT
TAPE UMBILICAL COTTON 1/8X30 (MISCELLANEOUS) ×3 IMPLANT
TOWEL OR 17X24 6PK STRL BLUE (TOWEL DISPOSABLE) ×3 IMPLANT
TOWEL OR 17X26 10 PK STRL BLUE (TOWEL DISPOSABLE) ×3 IMPLANT
TRAY FOLEY W/METER SILVER 16FR (SET/KITS/TRAYS/PACK) ×3 IMPLANT
WATER STERILE IRR 1000ML POUR (IV SOLUTION) ×3 IMPLANT
YANKAUER SUCT BULB TIP NO VENT (SUCTIONS) ×3 IMPLANT

## 2017-07-13 NOTE — Transfer of Care (Signed)
Immediate Anesthesia Transfer of Care Note  Patient: Francisco Carpenter  Procedure(s) Performed: ANTERIOR CERVICAL DECOMPRESSION FUSION CERVICAL 5-6, CERVICAL 6-7, CERVICAL 7-THORACIC 1 WITH INSTRUMENTATION AND ALLOGRAFT; REQUEST 3.5 HOURS (N/A )  Patient Location: PACU  Anesthesia Type:General  Level of Consciousness: awake, alert  and oriented  Airway & Oxygen Therapy: Patient Spontanous Breathing and Patient connected to nasal cannula oxygen  Post-op Assessment: Report given to RN and Patient moving all extremities X 4  Post vital signs: Reviewed and stable  Last Vitals:  Vitals:   07/13/17 0617  BP: 109/70  Pulse: (!) 58  Resp: 18  Temp: 36.6 C  SpO2: 95%    Last Pain:  Vitals:   07/13/17 0617  TempSrc: Oral         Complications: No apparent anesthesia complications

## 2017-07-13 NOTE — Anesthesia Postprocedure Evaluation (Signed)
Anesthesia Post Note  Patient: Francisco Carpenter  Procedure(s) Performed: ANTERIOR CERVICAL DECOMPRESSION FUSION CERVICAL 5-6, CERVICAL 6-7, CERVICAL 7-THORACIC 1 WITH INSTRUMENTATION AND ALLOGRAFT; REQUEST 3.5 HOURS (N/A )     Patient location during evaluation: PACU Anesthesia Type: General Level of consciousness: awake and alert Pain management: pain level controlled Vital Signs Assessment: post-procedure vital signs reviewed and stable Respiratory status: spontaneous breathing, nonlabored ventilation and respiratory function stable Cardiovascular status: blood pressure returned to baseline and stable Postop Assessment: no apparent nausea or vomiting Anesthetic complications: no    Last Vitals:  Vitals:   07/13/17 1303 07/13/17 1313  BP: 127/78   Pulse: 83   Resp: 17   Temp:  36.7 C  SpO2: 95%     Last Pain:  Vitals:   07/13/17 1301  TempSrc:   PainSc: [place]

## 2017-07-13 NOTE — Anesthesia Preprocedure Evaluation (Signed)
Anesthesia Evaluation  Patient identified by MRN, date of birth, ID band Patient awake    Reviewed: Allergy & Precautions, NPO status , Patient's Chart, lab work & pertinent test results  Airway Mallampati: II  TM Distance: >3 FB Neck ROM: Full    Dental  (+) Dental Advisory Given   Pulmonary neg pulmonary ROS,    breath sounds clear to auscultation       Cardiovascular negative cardio ROS   Rhythm:Regular Rate:Normal     Neuro/Psych negative neurological ROS     GI/Hepatic negative GI ROS, Neg liver ROS,   Endo/Other  negative endocrine ROS  Renal/GU negative Renal ROS     Musculoskeletal   Abdominal   Peds  Hematology negative hematology ROS (+)   Anesthesia Other Findings Hx of alcohol abuse  Reproductive/Obstetrics                             Lab Results  Component Value Date   WBC 5.4 07/07/2017   HGB 13.5 07/07/2017   HCT 39.8 07/07/2017   MCV 87.3 07/07/2017   PLT 240 07/07/2017   Lab Results  Component Value Date   CREATININE 1.11 07/07/2017   BUN 12 07/07/2017   NA 139 07/07/2017   K 3.9 07/07/2017   CL 108 07/07/2017   CO2 24 07/07/2017    Anesthesia Physical  Anesthesia Plan  ASA: II  Anesthesia Plan: General   Post-op Pain Management:    Induction: Intravenous  PONV Risk Score and Plan: 2 and Ondansetron and Midazolam  Airway Management Planned: Oral ETT  Additional Equipment:   Intra-op Plan:   Post-operative Plan: Extubation in OR  Informed Consent: I have reviewed the patients History and Physical, chart, labs and discussed the procedure including the risks, benefits and alternatives for the proposed anesthesia with the patient or authorized representative who has indicated his/her understanding and acceptance.   Dental advisory given  Plan Discussed with: CRNA  Anesthesia Plan Comments:          Anesthesia Evaluation  Patient identified by MRN, date of birth, ID band Patient awake    Reviewed: Allergy & Precautions, NPO status , Patient's Chart, lab work & pertinent test results  Airway Mallampati: II  TM Distance: >3 FB Neck ROM: Full    Dental  (+) Dental Advisory Given   Pulmonary neg pulmonary ROS,    breath sounds clear to auscultation       Cardiovascular negative cardio ROS   Rhythm:Regular Rate:Normal     Neuro/Psych negative neurological ROS     GI/Hepatic negative GI ROS, Neg liver ROS,   Endo/Other  negative endocrine ROS  Renal/GU negative Renal ROS     Musculoskeletal   Abdominal   Peds  Hematology negative hematology ROS (+)   Anesthesia Other Findings   Reproductive/Obstetrics                            Lab Results  Component Value Date   WBC 5.4 07/07/2017   HGB 13.5 07/07/2017   HCT 39.8 07/07/2017   MCV 87.3 07/07/2017   PLT 240 07/07/2017   Lab Results  Component Value Date   CREATININE 1.11 07/07/2017   BUN 12 07/07/2017   NA 139 07/07/2017   K 3.9 07/07/2017   CL 108 07/07/2017   CO2 24 07/07/2017    Anesthesia Physical Anesthesia Plan  ASA: II  Anesthesia Plan: General   Post-op Pain Management:    Induction: Intravenous  Airway Management Planned: Oral ETT  Additional Equipment:   Intra-op Plan:   Post-operative Plan: Extubation in OR  Informed Consent: I have reviewed the patients History and Physical, chart, labs and discussed the procedure including the risks, benefits and alternatives for the proposed anesthesia with the patient or authorized representative who has indicated his/her understanding and acceptance.   Dental advisory given  Plan Discussed with: CRNA  Anesthesia Plan Comments:         Anesthesia Quick Evaluation  Anesthesia Quick Evaluation
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
## 2017-07-13 NOTE — Anesthesia Procedure Notes (Signed)
Procedure Name: Intubation Date/Time: 07/13/2017 7:41 AM Performed by: Barrington Ellison Pre-anesthesia Checklist: Patient identified, Emergency Drugs available, Suction available and Patient being monitored Patient Re-evaluated:Patient Re-evaluated prior to induction Oxygen Delivery Method: Circle System Utilized Preoxygenation: Pre-oxygenation with 100% oxygen Induction Type: IV induction Ventilation: Mask ventilation without difficulty and Oral airway inserted - appropriate to patient size Laryngoscope Size: Glidescope and 3 Grade View: Grade I Tube type: Oral Tube size: 7.5 mm Number of attempts: 1 Airway Equipment and Method: Stylet and Oral airway Placement Confirmation: ETT inserted through vocal cords under direct vision,  positive ETCO2 and breath sounds checked- equal and bilateral Secured at: 21 cm Tube secured with: Tape Dental Injury: Teeth and Oropharynx as per pre-operative assessment  Difficulty Due To: Difficult Airway-  due to neck instability

## 2017-07-14 ENCOUNTER — Encounter (HOSPITAL_COMMUNITY): Payer: Self-pay | Admitting: Orthopedic Surgery

## 2017-07-14 DIAGNOSIS — M5412 Radiculopathy, cervical region: Secondary | ICD-10-CM | POA: Diagnosis not present

## 2017-07-14 NOTE — Op Note (Addendum)
NAMEMarland Kitchen  Francisco Carpenter, Francisco Carpenter NO.:  [id number]  MEDICAL RECORD NO.:  [id number]  PHYSICIAN:  Phylliss Bob, MD           DATE OF BIRTH:  DATE OF PROCEDURE:  07/13/2017                              OPERATIVE REPORT   PREOPERATIVE DIAGNOSES: 1. Prominent left hand weakness with prominent atrophy of the left     thenar eminence. 2. Left-sided cervical radiculopathy. 3. 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  POSTOPERATIVE DIAGNOSES: 1. Prominent left hand weakness with prominent atrophy of the left     thenar eminence. 2. Left-sided cervical radiculopathy. 3. 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  PROCEDURES: 1. Anterior cervical decompression and fusion, C5-C6, C6-C7, C7-T1 2. Placement of anterior instrumentation C5, C6, C7, T1 3. Placement of intervertebral device x 3 (Titan Intervertebral spacers) C5-C6, C6-C7, C7-T1 4. Use of morselized allograft (Vivagen) 5. Intraoperative use of fluoroscopy  SURGEON:  Phylliss Bob, MD.  ASSISTANTPricilla Holm, PA-C.  ANESTHESIA:  General endotracheal anesthesia.  COMPLICATIONS:  None.  DISPOSITION:  Stable.  ESTIMATED BLOOD LOSS:  Minimal.  INDICATIONS FOR SURGERY:  Briefly, Francisco Carpenter is very pleasant 51 year old male who did present to me with prominent weakness in his left hand, including atrophy of the thenar eminence.  As noted above, his MRI did reveal spinal cord compression and neural foraminal stenosis at the levels noted above.  The patient did ultimately proceed with an EMG and nerve conduction study, which was notable for left-sided C7 and C8 radiculopathy, in addition to the possibility of ulnar neuropathy. Given the prominent findings on the cervical MRI in addition to his symptoms, in combination with his EMG, we did discuss proceeding with the  procedure noted above.  The patient was fully aware of the risks and limitations of surgery and did elect to proceed.  OPERATIVE DETAILS:  On July 13, 2017, the patient was brought to Surgery and general endotracheal anesthesia was administered.  The patient was placed supine on the hospital bed.  The patient's neck was gently extended.  The patient's arms were secured to his sides and all bony prominences were meticulously padded.  The neck was prepped and draped in the usual sterile fashion.  A time-out procedure was performed.  At this point, I did make a left-sided transverse incision, centered over the C6-7 intervertebral space.  The platysma was incised. A Smith-Robinson approach was used and the anterior spine was noted.  I then suppressed the exposed vertebral bodies of C5, C6, C7, and T1.  A self-retaining retractor was placed.  At this point, I did place Caspar pins into the C7 and T1 vertebral bodies and distraction was gently applied.  I then proceeded with a thorough and complete C7-T1 intervertebral diskectomy.  Of note, the neural foramen was noted to be rather stenotic, and this was decompressed uneventfully using #1 followed by #2 Kerrison.  The endplates were then prepared and the appropriate size intervertebral spacer was packed with ViviGen and tamped into position in the usual fashion.  I was very pleased with the press-fit of the implant.  The lower Caspar pin was  then removed and placed into the C6 vertebral body.  At this point, distraction was applied across the C6-7 intervertebral space.  Again, a thorough and complete C6-7 intervertebral diskectomy was performed, thereby decompressing the spinal canal and right and left neural foramina. After preparing the endplates, the appropriate size interbody spacer was packed with ViviGen and tamped into position in the usual fashion.  The lower Caspar pin was removed and placed into the C5 vertebral body. Once again,  distraction was applied, and as previously noted, a thorough central decompression, followed by a thorough bilateral neuroforaminal decompression was performed.  I was very pleased with the decompression at each level, which was confirmed using a nerve hook.  After preparing the endplates, the appropriate size interbody spacer was packed with ViviGen and tamped into position.  Bone wax was placed in the holes that remained from the previously placed Caspar pins.  The appropriate-sized anterior cervical plate was placed over the anterior spine.  A 14 mm variable angle screws were placed, 2 in each vertebral body from C5 to T1, for a total of 8 vertebral body screws.  The screws were then locked into the plate using the Cam locking mechanism.  I was very pleased with the final press-fit of each of the screws.  I was very pleased with the final fluoroscopic images.  At this point, the wound was irrigated and explored for any undue bleeding.  There were minor areas of bleeding noted which was controlled using bipolar electrocautery.  At this point, the wound was closed in layers using 2-0 Vicryl followed by 4-0 Monocryl.  Benzoin and Steri-Strips were applied followed by sterile dressing.  All instrument counts were correct.  Of note, Pricilla Holm was my assistant throughout the surgery and did aid in retraction, suctioning, and closure from start to finish.     Phylliss Bob, MD   ______________________________ Phylliss Bob, MD    MD/MEDQ  D:  07/13/2017  T:  07/13/2017  Job:  [id number]

## 2017-07-14 NOTE — Progress Notes (Signed)
Orthopedic Tech Progress Note Patient Details:  Francisco Carpenter 1966/09/30 [id number]  Ortho Devices Type of Ortho Device: [place] cervical collar Ortho Device/Splint Interventions: Loanne Drilling, Miia Blanks 07/14/2017, 10:18 AM Viewed order from doctor's order list

## 2017-07-14 NOTE — Progress Notes (Signed)
Patient is discharged from room 3C03 at this time. Alert and in stable condition. IV site d/c'd and instructions read to patient and wife with understanding verbalized. Left unit via wheelchair with all belongings at side.

## 2017-07-14 NOTE — Progress Notes (Signed)
    Patient doing well Patient denies arm pain   Physical Exam: Vitals:   07/14/17 0013 07/14/17 0400  BP: 135/85 (!) 149/90  Pulse: 82 88  Resp: 20 18  Temp: 98.7 F (37.1 C) 99.1 F (37.3 C)  SpO2: 95% 95%    Dressing in place Examination is at baseline, with left thenar atrophy noted Neck soft and supple  POD #1 s/p C5-T1 ACDF, doing well  - up with PT/OT, encourage ambulation - Percocet for pain, Valium for muscle spasms - likely d/c home later today with follow-up in 2 weeks

## 2017-07-19 NOTE — Discharge Summary (Signed)
Patient ID: Francisco Carpenter MRN: [id number] DOB/AGE: 1966-06-23 51 y.o.  Admit date: 07/13/2017 Discharge date: 07/14/2017  Admission Diagnoses:  Active Problems:   Radiculopathy   Discharge Diagnoses:  Same  Past Medical History:  Diagnosis Date  . Alcohol abuse   . Diverticulosis   . Overactive bladder   . Tubular adenoma 10/2009   Dr. Wilford Corner    Surgeries: Procedure(s): ANTERIOR CERVICAL DECOMPRESSION FUSION CERVICAL 5-6, CERVICAL 6-7, CERVICAL 7-THORACIC 1 WITH INSTRUMENTATION AND ALLOGRAFT; REQUEST 3.5 HOURS on 07/13/2017   Consultants: None  Discharged Condition: Improved  Hospital Course: Francisco Carpenter is an 51 y.o. male who was admitted 07/13/2017 for operative treatment of radiculopathy. Patient has severe unremitting pain that affects sleep, daily activities, and work/hobbies. After pre-op clearance the patient was taken to the operating room on 07/13/2017 and underwent  Procedure(s): ANTERIOR CERVICAL DECOMPRESSION FUSION CERVICAL 5-6, CERVICAL 6-7, CERVICAL 7-THORACIC 1 WITH INSTRUMENTATION AND ALLOGRAFT; REQUEST 3.5 HOURS.    Patient was given perioperative antibiotics:  Anti-infectives    Start     Dose/Rate Route Frequency Ordered Stop   07/13/17 1400  ceFAZolin (ANCEF) IVPB 2g/100 mL premix     2 g 200 mL/hr over 30 Minutes Intravenous Every 8 hours 07/13/17 1354 07/14/17 0744   07/13/17 0700  ceFAZolin (ANCEF) IVPB 2g/100 mL premix     2 g 200 mL/hr over 30 Minutes Intravenous To [facility]Stay [facility] 07/12/17 1330 07/13/17 0742       Patient was given sequential compression devices, early ambulation to prevent DVT.  Patient benefited maximally from hospital stay and there were no complications.    Recent vital signs: BP (!) 141/84 (BP Location: Right Arm)   Pulse 88   Temp 98.7 F (37.1 C) (Oral)   Resp 18   Ht 6' (1.829 m)   Wt 107.5 kg (237 lb)   SpO2 95%   BMI 32.14 kg/m    Discharge Medications:   Allergies as of 07/14/2017     No Known Allergies     Medication List    TAKE these medications   Coenzyme Q10 200 MG capsule Take 200 mg by mouth daily.   Glucosamine-Vitamin D 1000-200 MG-UNIT Tabs Take 1 tablet by mouth daily.   multivitamin with minerals Tabs tablet Take 1 tablet by mouth daily.   oxybutynin 10 MG 24 hr tablet Commonly known as:  DITROPAN-XL Take 10 mg by mouth daily.   tamsulosin 0.4 MG Caps capsule Commonly known as:  FLOMAX Take 0.4 mg by mouth at bedtime.       Diagnostic Studies: Dg Chest 2 View  Result Date: 07/07/2017 CLINICAL DATA:  Preoperative evaluation for upcoming cervical spine surgery EXAM: CHEST  2 VIEW COMPARISON:  None. FINDINGS: Cardiac shadow is within normal limits. Minimal left basilar atelectasis is noted. No focal infiltrate or effusion is seen. No acute bony abnormality is noted. IMPRESSION: Minimal left basilar atelectasis. Electronically Signed   By: Inez Catalina M.D.   On: 07/07/2017 15:35   Dg Cervical Spine 2-3 Views  Result Date: 07/13/2017 CLINICAL DATA:  Anterior fusion EXAM: CERVICAL SPINE - 2-3 VIEW; DG C-ARM 61-120 MIN COMPARISON:  Cervical MRI May 04, 2017 FINDINGS: Cross-table lateral cervical image time stamped 7:55:40 submitted. Metallic probe tip is anterior to the superior aspect of the C7 vertebral body. Cross-table lateral cervical image time stamped 11:11: 26 submitted. There is anterior screw and plate fixation from E1-D4. Disc spacers are noted at C5-6, C6-7, and C7-T1. No evident  fracture. There is slight retrolisthesis of C4 on C5. There is moderate disc space narrowing at C4-5. IMPRESSION: Anterior screw and plate fixation from H6-W7 with disc spacers at C5-6, C6-7, and C7-T1. No fracture. Disc space narrowing noted at C4-5 with slight retrolisthesis of C4 on C5. Electronically Signed   By: Lowella Grip III M.D.   On: 07/13/2017 11:39   Dg C-arm 61-120 Min  Result Date: 07/13/2017 CLINICAL DATA:  Anterior fusion EXAM: CERVICAL SPINE  - 2-3 VIEW; DG C-ARM 61-120 MIN COMPARISON:  Cervical MRI May 04, 2017 FINDINGS: Cross-table lateral cervical image time stamped 7:55:40 submitted. Metallic probe tip is anterior to the superior aspect of the C7 vertebral body. Cross-table lateral cervical image time stamped 11:11: 26 submitted. There is anterior screw and plate fixation from P7-T0. Disc spacers are noted at C5-6, C6-7, and C7-T1. No evident fracture. There is slight retrolisthesis of C4 on C5. There is moderate disc space narrowing at C4-5. IMPRESSION: Anterior screw and plate fixation from G2-I9 with disc spacers at C5-6, C6-7, and C7-T1. No fracture. Disc space narrowing noted at C4-5 with slight retrolisthesis of C4 on C5. Electronically Signed   By: Lowella Grip III M.D.   On: 07/13/2017 11:39    Disposition: 01-Home or Self Care   POD #1 s/p C5-T1 ACDF, doing well  -Written scripts for pain signed and in chart -D/C instructions sheet printed and in chart -D/C today  -F/U in office 2 weeks   Signed: Justice Britain 07/19/2017, 1:53 PM

## 2017-07-31 ENCOUNTER — Encounter: Payer: Self-pay | Admitting: Physician Assistant

## 2017-07-31 ENCOUNTER — Encounter (INDEPENDENT_AMBULATORY_CARE_PROVIDER_SITE_OTHER): Payer: Self-pay

## 2017-07-31 ENCOUNTER — Ambulatory Visit (INDEPENDENT_AMBULATORY_CARE_PROVIDER_SITE_OTHER): Payer: BLUE CROSS/BLUE SHIELD | Admitting: Physician Assistant

## 2017-07-31 VITALS — BP 116/80 | HR 76 | Ht 70.5 in | Wt 237.5 lb

## 2017-07-31 DIAGNOSIS — M542 Cervicalgia: Secondary | ICD-10-CM | POA: Diagnosis not present

## 2017-07-31 DIAGNOSIS — G441 Vascular headache, not elsewhere classified: Secondary | ICD-10-CM | POA: Diagnosis not present

## 2017-07-31 NOTE — Progress Notes (Addendum)
Subjective:    Patient ID: Francisco Carpenter, male    DOB: Sep 25, 1966, 51 y.o.   MRN: [id number]  HPI Francisco Carpenter is a pleasant 51 year old white male, known to Dr. Carlean Purl with previous history of colon polyps, diverticulosis and recurrent diverticulitis. He last had colonoscopy in March 2016 which showed severe pandiverticulosis, internal hemorrhoids and he had 2 sessile polyps removed from the rectum which were found to be hyperplastic. He was indicated for 10 year interval follow-up. Patient comes in today with new complaint of 5 distinct episodes of intense pressure and discomfort in the back of his neck radiating up into his head, associated with diaphoresis and documented significant hypotension with blood pressure as high as 197/107, that have occurred with a bowel movement. He says he has not had any straining hard stools or abdominal discomfort at all and that these episodes have occurred with passage of soft stools. Of note he did have a anterior cervical decompression and fusion from C5-T1, including placement of anterior instrumentation, placement of intervertebral device 3 and allograft done by Dr. Phylliss Bob, on 07/13/2017. He is wearing a cervical collar. Patient states that he was feeling perfectly fine prior to his surgery and had no GI issues. He has never had any similar type symptoms prior to his surgical procedure In between these episodes he has had some bowel movements without any recurrence of symptoms. He also states he feels fine in between these episodes. He has been able to wean off of pain medications and muscle relaxers over the past couple of weeks. When these episodes have occurred he feels bad and has had 2 breast and says that symptoms gradually resolve over about 15 minutes and his blood pressure has normalized according to his wife. He denies any ongoing issues with headache dizziness etc. No syncope or presyncope. His last episode was about 5 days ago. He had been seen  in follow-up at the surgeon's office, also saw his PCP, says he was told to take stool softeners and to be seen by GI.  Review of Systems  Pertinent positive and negative review of systems were noted in the above HPI section.  All other review of systems was otherwise negative.  Outpatient Encounter Prescriptions as of 07/31/2017  Medication Sig  . Coenzyme Q10 200 MG capsule Take 200 mg by mouth daily.  . Glucosamine-Vitamin D 1000-200 MG-UNIT TABS Take 1 tablet by mouth daily.  . Multiple Vitamin (MULTIVITAMIN WITH MINERALS) TABS tablet Take 1 tablet by mouth daily.  Marland Kitchen oxybutynin (DITROPAN-XL) 10 MG 24 hr tablet Take 10 mg by mouth daily.   . tamsulosin (FLOMAX) 0.4 MG CAPS capsule Take 0.4 mg by mouth at bedtime.    No facility-administered encounter medications on file as of 07/31/2017.    No Known Allergies Patient Active Problem List   Diagnosis Date Noted  . Radiculopathy 07/13/2017  . HNP (herniated nucleus pulposus), lumbar 03/30/2016  . Internal hemorrhoids with prolapse, bleeding, seepage 12/26/2013  . Diverticulitis - recurrent 12/26/2013  . History of colonic polyps 12/26/2013  . Umbilical hernia 44/31/5400   Social History   Social History  . Marital status: Married    Spouse name: N/A  . Number of children: 0  . Years of education: N/A   Occupational History  . 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  [employer]   Social History Main Topics  . Smoking status: Never Smoker  . Smokeless tobacco: Never Used  . Alcohol use  No     Comment: prior abuse-sober since 2010  . Drug use: No  . Sexual activity: Not on file   Other Topics Concern  . 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 Vandermeulen family history includes Colon polyps in his father; Diabetes in his father and mother.      Objective:    Vitals:    07/31/17 1430  BP: 116/80  Pulse: 76    Physical Exam;Well-developed white male in no acute distress, wearing a cervical collar, accompanied by his wife, blood pressure 116/80, pulse 76, BMI 33.6. HEENT; nontraumatic normocephalic EOMI PERRLA sclera anicteric, Cardiovascular; regular rate and rhythm with S1-S2 no murmur or gallop, Pulmonary; clear bilaterally, Abdomen; soft nontender nondistended bowel sounds are active there is no palpable mass or hepatosplenomegaly, Rectal ;exam not done, Extremities; no clubbing cyanosis or edema skin warm and dry, Neuropsych ;mood and affect appropriate       Assessment & Plan:   #57 51 year old white male status post anterior cervical decompression and fusion C5-T1 on 07/13/2017 who comes in today with complaints of 5 episodes of intense pressure in the back of his neck and head, diaphoresis and documented marked hypertension each of which has occurred with a normal bowel movement, not associated with any excessive straining constipation etc. Patient has been asymptomatic in between episodes and denies any ongoing headache dizziness etc. Etiology of his symptoms is not clear, this is not a primary GI issue though symptoms are associated with normal Valsalva associated with bowel movements. I am concerned that he has a neurovascular issue and his 2 weeks out from cervical decompression. Question vertebrobasilar compromise  #2 history of pan diverticulosis and recurrent diverticulitis #3 hyperplastic colon polyps  Plan; I think patient needs further imaging, possibly of neck and brain with angiography. I called and spoke to his surgeon Dr. Phylliss Bob who suggested patient  may need to be seen by vascular surgery. He suggested the patient may have had rupture of plaque. He did not feel he needed to be seen back in his office.  I also discussed case with Dr. Hilarie Fredrickson, we will try to expedite either neuro or vascular surgery consultation within the next few  days. In the interim patient will be advised to proceed to the emergency room if he has another episode.   Doye Montilla Genia Harold PA-C 07/31/2017   Cc: Fanny Bien, MD  Agree with Ms. Genia Harold assessment and plan. Gatha Mayer, MD, [facility]

## 2017-07-31 NOTE — Patient Instructions (Signed)
Amy Esterwood PA has a call in to your orthopedic surgeon.

## 2017-08-01 ENCOUNTER — Other Ambulatory Visit: Payer: Self-pay

## 2017-08-01 ENCOUNTER — Telehealth: Payer: Self-pay

## 2017-08-01 DIAGNOSIS — S129XXS Fracture of neck, unspecified, sequela: Secondary | ICD-10-CM

## 2017-08-01 NOTE — Telephone Encounter (Signed)
Urgent referral through [facility]. Trying to get in touch with the patient to advise of this plan. No answer at the home phone. No voicemail on the patient's cell phone.

## 2017-08-01 NOTE — Telephone Encounter (Signed)
-----   Message from Alfredia Ferguson, PA-C sent at 08/01/2017  8:06 AM EDT ----- Regarding: refer pt  Beth -I saw this pt yesterday -see my note- he is 2 weeks s/p cervical decompression and fusion and having  Episodes of intense pressure/pain back of neck in to back of head associated with diaphoresis , and severe htn  (BP 197/ 110) that are occurring off and on with BM'S - this is a neuro vascular issue of some sort - please see if can get him worker in urgently with [facility] - DR Tomi Likens- and when you call pt tell him that I advise if he has another episode to go to the ER  To get imaging of neck and brain

## 2017-08-01 NOTE — Telephone Encounter (Signed)
[facility] and confirmed the referral.

## 2017-08-02 ENCOUNTER — Encounter: Payer: Self-pay | Admitting: Neurology

## 2017-08-09 ENCOUNTER — Ambulatory Visit (INDEPENDENT_AMBULATORY_CARE_PROVIDER_SITE_OTHER): Payer: BLUE CROSS/BLUE SHIELD | Admitting: Neurology

## 2017-08-09 ENCOUNTER — Encounter: Payer: Self-pay | Admitting: Neurology

## 2017-08-09 VITALS — BP 118/80 | HR 78 | Ht 72.0 in | Wt 241.0 lb

## 2017-08-09 DIAGNOSIS — G4453 Primary thunderclap headache: Secondary | ICD-10-CM

## 2017-08-09 DIAGNOSIS — G4484 Primary exertional headache: Secondary | ICD-10-CM

## 2017-08-09 DIAGNOSIS — M4802 Spinal stenosis, cervical region: Secondary | ICD-10-CM
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
## 2017-08-09 NOTE — Patient Instructions (Signed)
1.  We will check MRI brain with and without contrast and MRA of head and neck  2. Further recommendations pending results

## 2017-08-21 ENCOUNTER — Other Ambulatory Visit: Payer: Self-pay | Admitting: *Deleted

## 2017-08-21 DIAGNOSIS — M4802 Spinal stenosis, cervical region: Secondary | ICD-10-CM

## 2017-08-21 DIAGNOSIS — G4453 Primary thunderclap headache: Secondary | ICD-10-CM

## 2017-08-21 DIAGNOSIS — G4484 Primary exertional headache: Secondary | ICD-10-CM

## 2017-09-06 ENCOUNTER — Ambulatory Visit
Admission: RE | Admit: 2017-09-06 | Discharge: 2017-09-06 | Disposition: A | Discharge status: Home / Self Care | Payer: BLUE CROSS/BLUE SHIELD | Source: Ambulatory Visit | Attending: Neurology | Admitting: Neurology

## 2017-09-06 DIAGNOSIS — G4453 Primary thunderclap headache: Secondary | ICD-10-CM

## 2017-09-06 DIAGNOSIS — M4802 Spinal stenosis, cervical region: Secondary | ICD-10-CM

## 2017-09-06 DIAGNOSIS — G4484 Primary exertional headache: Secondary | ICD-10-CM

## 2017-09-06 MED ORDER — GADOBENATE DIMEGLUMINE 529 MG/ML IV SOLN
17.0000 mL | Freq: Once | INTRAVENOUS | Status: AC | PRN
  Administered 2017-09-06: 17 mL via INTRAVENOUS

## 2017-09-07 ENCOUNTER — Telehealth: Payer: Self-pay

## 2017-09-07 NOTE — Telephone Encounter (Signed)
-----   Message from Pieter Partridge, DO sent at 09/07/2017  9:57 AM EST ----- MRIs are normal

## 2017-09-07 NOTE — Telephone Encounter (Signed)
LM on home phone answering machine, advising both studies are normal and to call with any questions

## 2017-11-13 DIAGNOSIS — R200 Anesthesia of skin: Secondary | ICD-10-CM | POA: Diagnosis not present

## 2017-12-01 DIAGNOSIS — G5622 Lesion of ulnar nerve, left upper limb: Secondary | ICD-10-CM | POA: Diagnosis not present

## 2017-12-01 DIAGNOSIS — R200 Anesthesia of skin: Secondary | ICD-10-CM | POA: Diagnosis not present

## 2017-12-26 DIAGNOSIS — Z13220 Encounter for screening for lipoid disorders: Secondary | ICD-10-CM | POA: Diagnosis not present

## 2017-12-26 DIAGNOSIS — Z131 Encounter for screening for diabetes mellitus: Secondary | ICD-10-CM | POA: Diagnosis not present

## 2017-12-28 DIAGNOSIS — E1165 Type 2 diabetes mellitus with hyperglycemia: Secondary | ICD-10-CM | POA: Diagnosis not present

## 2017-12-28 DIAGNOSIS — E782 Mixed hyperlipidemia: Secondary | ICD-10-CM | POA: Diagnosis not present

## 2017-12-28 DIAGNOSIS — R030 Elevated blood-pressure reading, without diagnosis of hypertension: Secondary | ICD-10-CM | POA: Diagnosis not present

## 2017-12-28 DIAGNOSIS — R0602 Shortness of breath: Secondary | ICD-10-CM | POA: Diagnosis not present

## 2018-02-28 DIAGNOSIS — G5622 Lesion of ulnar nerve, left upper limb: Secondary | ICD-10-CM | POA: Diagnosis not present

## 2018-03-13 DIAGNOSIS — M25522 Pain in left elbow: Secondary | ICD-10-CM | POA: Diagnosis not present

## 2018-03-15 DIAGNOSIS — M25522 Pain in left elbow: Secondary | ICD-10-CM | POA: Diagnosis not present

## 2018-03-22 IMAGING — MR MR MRA NECK WO/W CM
15 of 16 series · 46 of 48 positions shown · IV contrast (multihance)
Comparison: None.

CLINICAL DATA: Exertional headaches.  Hypertension.

EXAM:
MRI HEAD WITHOUT AND WITH CONTRAST
MRA HEAD WITHOUT CONTRAST
MRA NECK WITHOUT AND WITH CONTRAST
TECHNIQUE: Multiplanar, multiecho pulse sequences of the brain and surrounding
structures were obtained without and with intravenous contrast.
Angiographic images of the Circle of Willis were obtained using MRA
technique without intravenous contrast. Angiographic images of the
neck were obtained using MRA technique without and with intravenous
contrast. Carotid stenosis measurements (when applicable) are
obtained utilizing NASCET criteria, using the distal internal
carotid diameter as the denominator.
CONTRAST:  17mL MULTIHANCE GADOBENATE DIMEGLUMINE 529 MG/ML IV SOLN

[Series 2: t1_se_sag · sagittal · 5.0mm · 0.45mm/px · 2 of 21 slices shown]
[im 1/21]
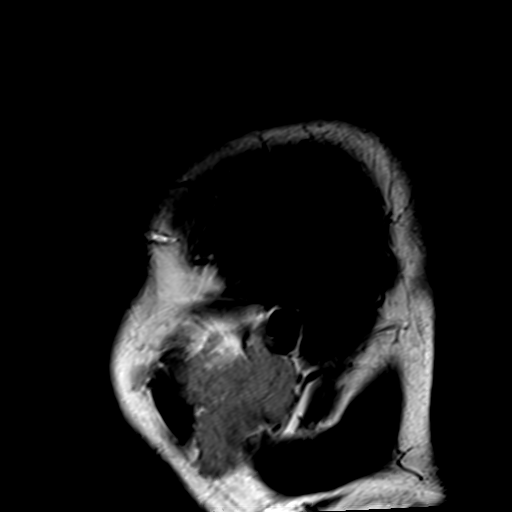
[im 21/21]
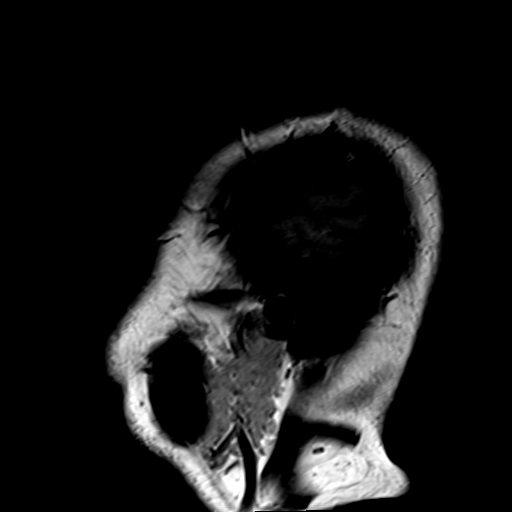

[Series 3: ep2d_diff_(id)_trace · axial · 3.0mm · 1.80mm/px · z∈[-32,+108]mm · 5 of 93 slices shown]
[im 1/93]
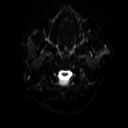
[im 24/93]
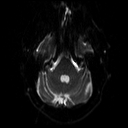
[im 47/93]
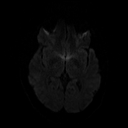
[im 70/93]
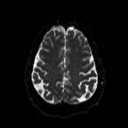
[im 93/93]
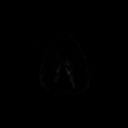

[Series 4: ep2d_diff_(id)_trace_adc · axial · 3.0mm · 1.80mm/px · z∈[-32,+108]mm · 2 of 47 slices shown]
[im 1/47]
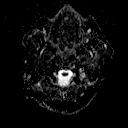
[im 47/47]
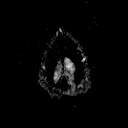

[Series 5: ep2d_diff_cor · coronal · 5.0mm · 1.77mm/px · 2 of 48 slices shown]
[im 1/48]
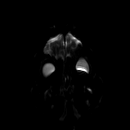
[im 48/48]
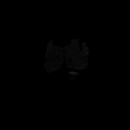

[Series 6: ep2d_diff_cor_adc · coronal · 5.0mm · 1.77mm/px · 1 of 24 slices shown]
[im 1/24]
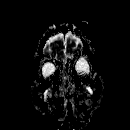

[Series 8: swi_images · axial · 2.0mm · 0.90mm/px · z∈[-39,+118]mm · 4 of 80 slices shown]
[im 1/80]
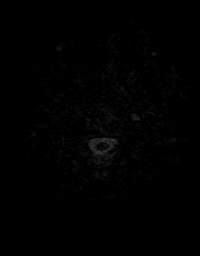
[im 27/80]
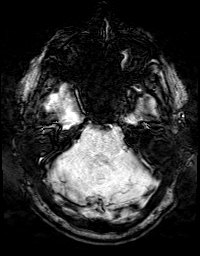
[im 53/80]
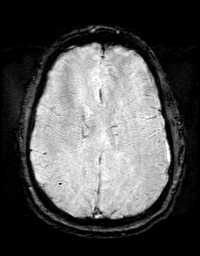
[im 80/80]
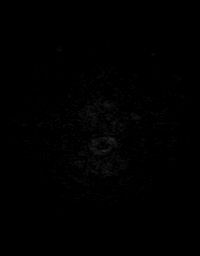

[Series 9: FLAIR · axial · 3.0mm · 0.43mm/px · 1 of 27 slices shown]
[im 1/27]
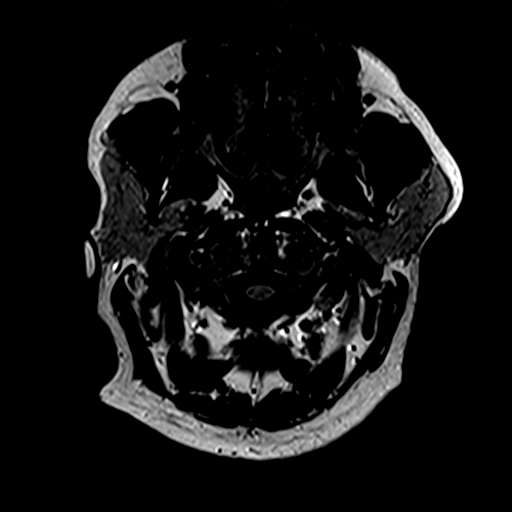

[Series 10: t2_tse_tra_512 · axial · 5.0mm · 0.60mm/px · 1 of 24 slices shown]
[im 1/24]
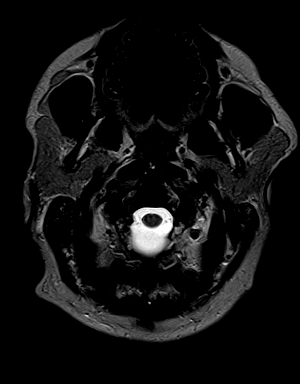

[Series 11: tof_3d_multi-slab new · axial · 0.7mm · 0.35mm/px · z∈[-14,+77]mm · 6 of 130 slices shown]
[im 1/130]
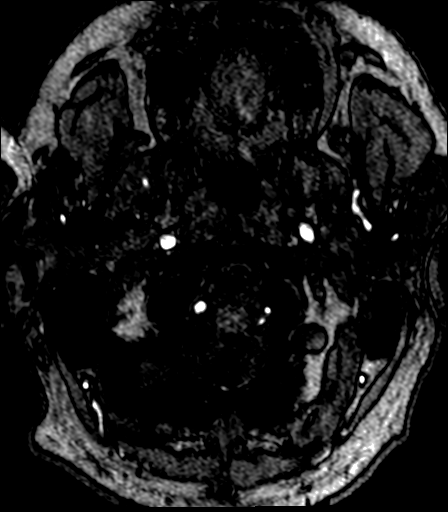
[im 26/130]
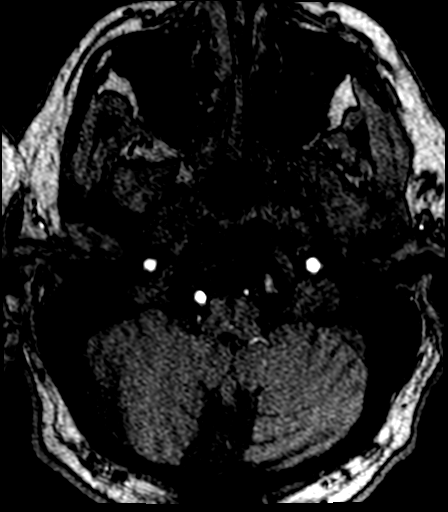
[im 52/130]
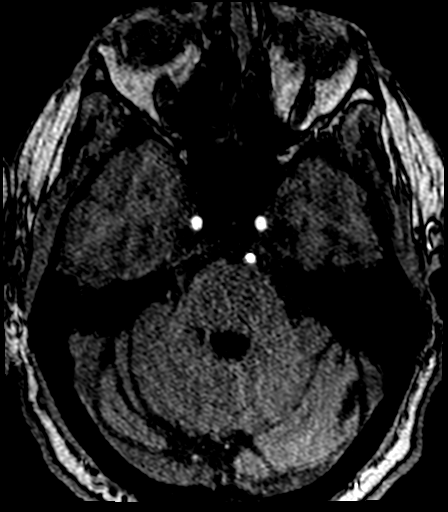
[im 78/130]
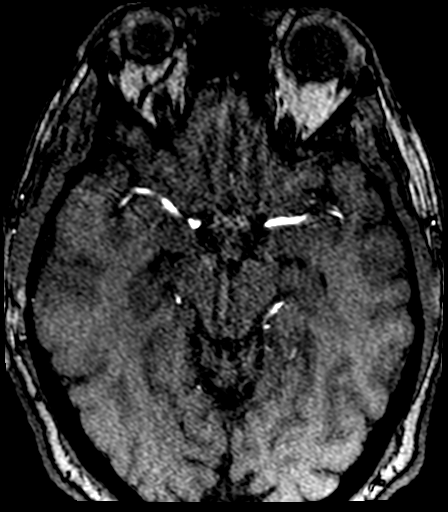
[im 104/130]
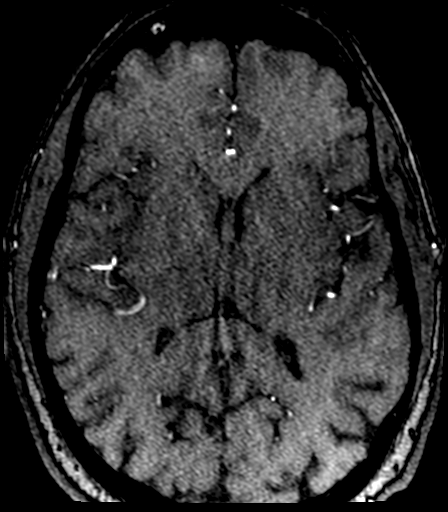
[im 130/130]
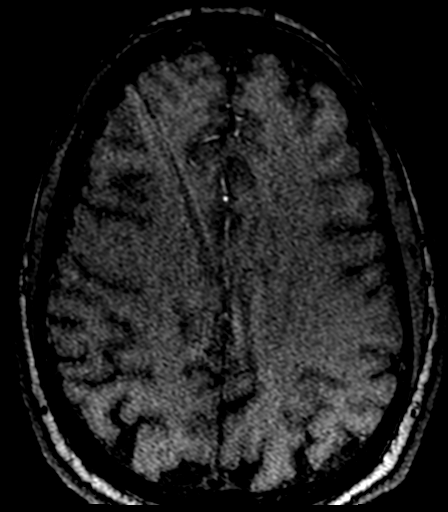

[Series 15: t1_mpr_tra · axial · 1.0mm · 0.72mm/px · z∈[-40,+119]mm · 7 of 160 slices shown]
[im 1/160]
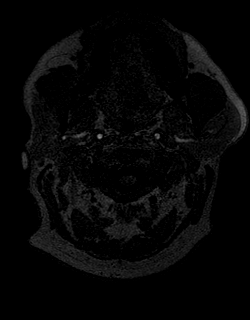
[im 27/160]
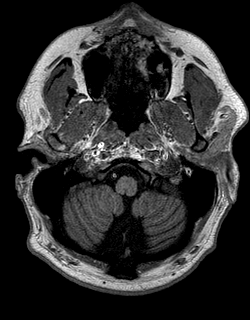
[im 54/160]
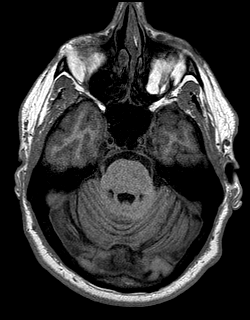
[im 80/160]
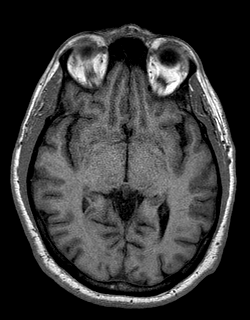
[im 107/160]
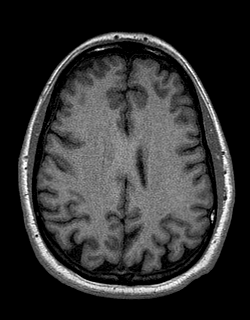
[im 133/160]
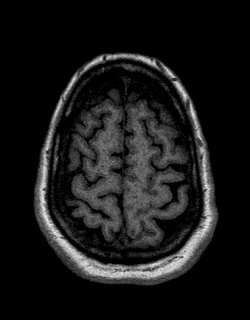
[im 160/160]
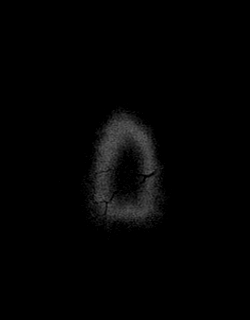

[Series 19: (id) · axial · 3.0mm · 0.98mm/px · z∈[-174,-50]mm · 3 of 63 slices shown]
[im 1/63]
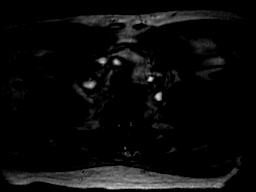
[im 32/63]
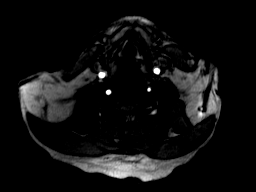
[im 63/63]
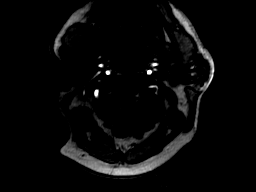

[Series 29: (id)_tt=1.0s_sub · coronal · 0.8mm · 0.78mm/px · 4 of 79 slices shown]
[im 1/79]
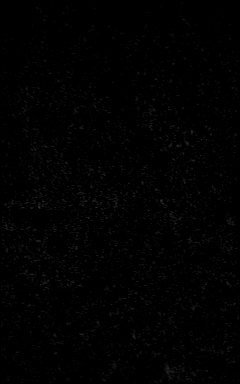
[im 27/79]
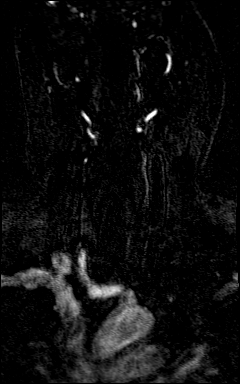
[im 53/79]
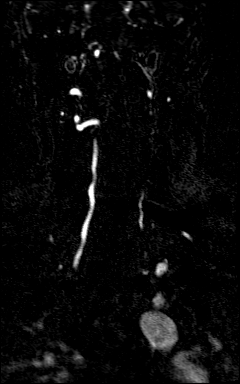
[im 79/79]
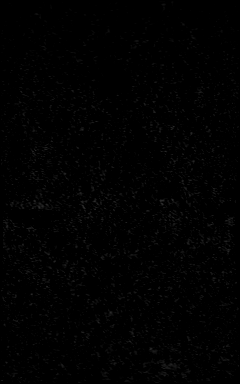

[Series 31: T2 · coronal · 5.0mm · 0.45mm/px · 1 of 26 slices shown]
[im 1/26]
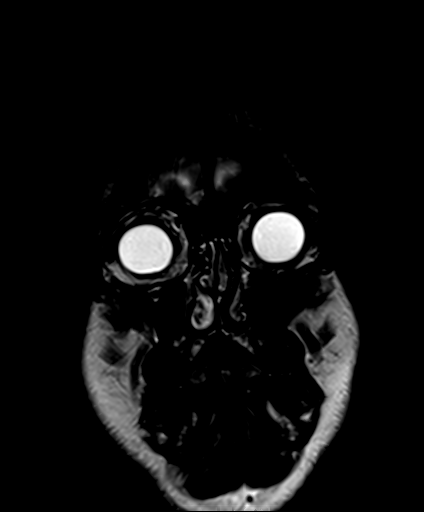

[Series 36: T1 post-contrast · coronal · 5.0mm · 0.72mm/px · 1 of 26 slices shown]
[im 1/26]
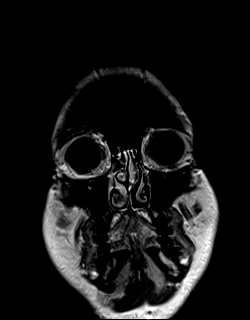

[Series 40: post t1_mpr_tra · axial · 1.0mm · 0.72mm/px · z∈[-40,+92]mm · 6 of 160 slices shown]
[im 1/160]
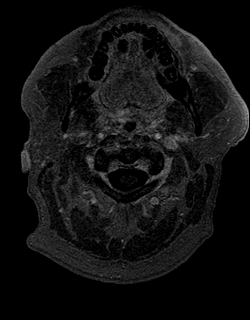
[im 27/160]
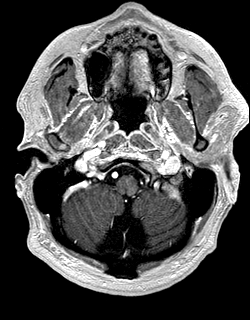
[im 54/160]
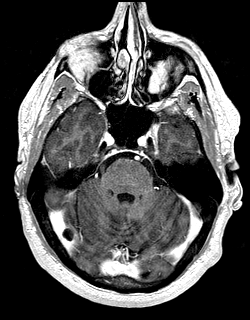
[im 80/160]
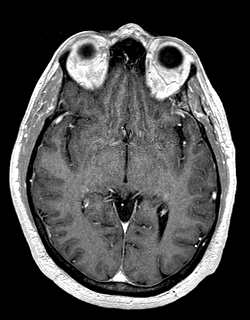
[im 107/160]
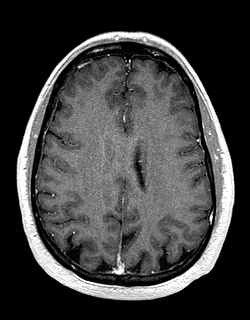
[im 133/160]
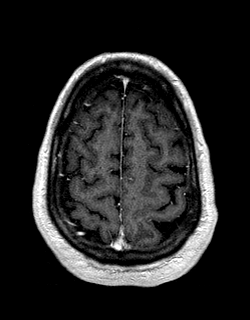

[46 of 48 positions shown; findings below may reference images not displayed]

FINDINGS: MRI HEAD FINDINGS

Brain: The midline structures are normal. No focal diffusion
restriction to indicate acute infarct. No intraparenchymal
hemorrhage. The brain parenchymal signal is normal. No mass lesion.
No chronic microhemorrhage or cerebral amyloid angiopathy. No
hydrocephalus, age advanced atrophy or lobar predominant volume
loss. No dural abnormality or extra-axial collection.

Skull and upper cervical spine: The visualized skull base,
calvarium, upper cervical spine and extracranial soft tissues are
normal.

Sinuses/Orbits: No fluid levels or advanced mucosal thickening. No
mastoid effusion. Normal orbits.

MRA HEAD FINDINGS

Intracranial internal carotid arteries: Normal.

Anterior cerebral arteries: Normal.

Middle cerebral arteries: Normal.

Posterior communicating arteries: Absent

Posterior cerebral arteries: Normal.

Basilar artery: Normal.

Vertebral arteries: Left dominant. Normal.

Superior cerebellar arteries: Normal.

Anterior inferior cerebellar arteries: Normal.

Posterior inferior cerebellar arteries: Normal.

MRA NECK FINDINGS

Aortic arch: Normal 3 vessel aortic branching pattern. The
visualized subclavian arteries are normal.

Right carotid system: Normal course and caliber without stenosis or
evidence of dissection.

Left carotid system: Normal course and caliber without stenosis or
evidence of dissection.

Vertebral arteries: Right dominant. Vertebral artery origins are
normal. Vertebral arteries are normal in course and caliber to the
vertebrobasilar confluence without stenosis or evidence of
dissection.
IMPRESSION: 1. Normal MRI of the brain.
2. Normal MRA of the head and neck.

## 2018-03-26 DIAGNOSIS — R7303 Prediabetes: Secondary | ICD-10-CM | POA: Diagnosis not present

## 2018-03-26 DIAGNOSIS — E782 Mixed hyperlipidemia: Secondary | ICD-10-CM | POA: Diagnosis not present

## 2018-03-29 DIAGNOSIS — M25522 Pain in left elbow: Secondary | ICD-10-CM | POA: Diagnosis not present

## 2018-04-10 DIAGNOSIS — E782 Mixed hyperlipidemia: Secondary | ICD-10-CM | POA: Diagnosis not present

## 2018-04-10 DIAGNOSIS — E1165 Type 2 diabetes mellitus with hyperglycemia: Secondary | ICD-10-CM | POA: Diagnosis not present

## 2018-05-03 DIAGNOSIS — E119 Type 2 diabetes mellitus without complications: Secondary | ICD-10-CM | POA: Diagnosis not present

## 2018-06-05 DIAGNOSIS — R739 Hyperglycemia, unspecified: Secondary | ICD-10-CM | POA: Diagnosis not present

## 2018-06-05 DIAGNOSIS — E782 Mixed hyperlipidemia: Secondary | ICD-10-CM | POA: Diagnosis not present

## 2018-06-07 DIAGNOSIS — E663 Overweight: Secondary | ICD-10-CM | POA: Diagnosis not present

## 2018-06-07 DIAGNOSIS — E782 Mixed hyperlipidemia: Secondary | ICD-10-CM | POA: Diagnosis not present

## 2018-06-07 DIAGNOSIS — E1165 Type 2 diabetes mellitus with hyperglycemia: Secondary | ICD-10-CM | POA: Diagnosis not present

## 2018-07-16 DIAGNOSIS — I10 Essential (primary) hypertension: Secondary | ICD-10-CM | POA: Diagnosis not present

## 2018-07-16 DIAGNOSIS — E1165 Type 2 diabetes mellitus with hyperglycemia: Secondary | ICD-10-CM | POA: Diagnosis not present

## 2018-07-20 DIAGNOSIS — Z23 Encounter for immunization: Secondary | ICD-10-CM | POA: Diagnosis not present

## 2018-07-20 DIAGNOSIS — E663 Overweight: Secondary | ICD-10-CM | POA: Diagnosis not present

## 2018-07-20 DIAGNOSIS — R030 Elevated blood-pressure reading, without diagnosis of hypertension: Secondary | ICD-10-CM | POA: Diagnosis not present

## 2018-07-20 DIAGNOSIS — E1165 Type 2 diabetes mellitus with hyperglycemia: Secondary | ICD-10-CM | POA: Diagnosis not present

## 2018-11-08 DIAGNOSIS — E1169 Type 2 diabetes mellitus with other specified complication: Secondary | ICD-10-CM | POA: Diagnosis not present

## 2018-11-08 DIAGNOSIS — E785 Hyperlipidemia, unspecified: Secondary | ICD-10-CM | POA: Diagnosis not present

## 2018-11-08 DIAGNOSIS — R208 Other disturbances of skin sensation: Secondary | ICD-10-CM | POA: Diagnosis not present

## 2019-02-11 DIAGNOSIS — E785 Hyperlipidemia, unspecified: Secondary | ICD-10-CM | POA: Diagnosis not present

## 2019-02-11 DIAGNOSIS — R39198 Other difficulties with micturition: Secondary | ICD-10-CM | POA: Diagnosis not present

## 2019-02-11 DIAGNOSIS — E1169 Type 2 diabetes mellitus with other specified complication: Secondary | ICD-10-CM | POA: Diagnosis not present

## 2019-02-11 DIAGNOSIS — R208 Other disturbances of skin sensation: Secondary | ICD-10-CM | POA: Diagnosis not present

## 2019-12-21 ENCOUNTER — Ambulatory Visit: Payer: BLUE CROSS/BLUE SHIELD | Attending: Internal Medicine

## 2019-12-21 DIAGNOSIS — Z23 Encounter for immunization: Secondary | ICD-10-CM

## 2019-12-21 NOTE — Progress Notes (Signed)
   Covid-19 Vaccination Clinic  Name:  Francisco Carpenter    MRN: [id number] DOB: March 16, 1966  12/21/2019  Mr. Francisco Carpenter was observed post Covid-19 immunization for 15 minutes without incident. He was provided with Vaccine Information Sheet and instruction to access the V-Safe system.   Mr. Francisco Carpenter was instructed to call 911 with any severe reactions post vaccine: Marland Kitchen Difficulty breathing  . Swelling of face and throat  . A fast heartbeat  . A bad rash all over body  . Dizziness and weakness   Immunizations Administered    Name Date Dose VIS Date Route   Pfizer COVID-19 Vaccine 12/21/2019 12:47 PM 0.3 mL 09/20/2019 Intramuscular   Manufacturer: [place]   Lot: [id number]   [place]: [id number]

## 2019-12-25 ENCOUNTER — Ambulatory Visit: Payer: BLUE CROSS/BLUE SHIELD | Attending: Internal Medicine

## 2019-12-25 DIAGNOSIS — Z20822 Contact with and (suspected) exposure to covid-19: Secondary | ICD-10-CM | POA: Insufficient documentation

## 2019-12-26 LAB — NOVEL CORONAVIRUS, NAA: SARS-CoV-2, NAA: NOT DETECTED

## 2020-01-21 ENCOUNTER — Ambulatory Visit: Payer: BLUE CROSS/BLUE SHIELD | Attending: Internal Medicine

## 2020-01-21 DIAGNOSIS — Z23 Encounter for immunization: Secondary | ICD-10-CM

## 2020-01-21 NOTE — Progress Notes (Signed)
   Covid-19 Vaccination Clinic  Name:  Francisco Carpenter    MRN: [id number] DOB: Feb 18, 1966  01/21/2020  Mr. [name] was observed post Covid-19 immunization for 15 minutes without incident. He was provided with Vaccine Information Sheet and instruction to access the V-Safe system.   Mr. [name] was instructed to call 911 with any severe reactions post vaccine: Marland Kitchen Difficulty breathing  . Swelling of face and throat  . A fast heartbeat  . A bad rash all over body  . Dizziness and weakness   Immunizations Administered    Name Date Dose VIS Date Route   Pfizer COVID-19 Vaccine 01/21/2020  4:39 PM 0.3 mL 09/20/2019 Intramuscular   Manufacturer: [place]   Lot: [id number]   [place]: [id number]

## 2021-01-27 ENCOUNTER — Other Ambulatory Visit: Payer: Self-pay

## 2021-01-27 ENCOUNTER — Encounter: Payer: Self-pay | Admitting: Gastroenterology

## 2021-01-27 ENCOUNTER — Ambulatory Visit (INDEPENDENT_AMBULATORY_CARE_PROVIDER_SITE_OTHER): Payer: 59 | Admitting: Gastroenterology

## 2021-01-27 VITALS — BP 118/70 | HR 80 | Ht 71.0 in | Wt 246.2 lb

## 2021-01-27 DIAGNOSIS — K6289 Other specified diseases of anus and rectum: Secondary | ICD-10-CM | POA: Insufficient documentation

## 2021-01-27 DIAGNOSIS — K644 Residual hemorrhoidal skin tags: Secondary | ICD-10-CM

## 2021-01-27 DIAGNOSIS — K625 Hemorrhage of anus and rectum: Secondary | ICD-10-CM | POA: Diagnosis not present

## 2021-01-27 MED ORDER — HYDROCORTISONE (PERIANAL) 2.5 % EX CREA: 1.0000 "application " | TOPICAL_CREAM | Freq: Three times a day (TID) | CUTANEOUS | 1 refills | Status: DC

## 2021-01-27 NOTE — Patient Instructions (Signed)
If you are age 55 or older, your body mass index should be between 23-30. Your Body mass index is 34.34 kg/m. If this is out of the aforementioned range listed, please consider follow up with your Primary Care Provider.  If you are age 41 or younger, your body mass index should be between 19-25. Your Body mass index is 34.34 kg/m. If this is out of the aformentioned range listed, please consider follow up with your Primary Care Provider.   Start Benefiber, 2 teaspoons in 8 oz of water or juice once a day and twice a day if needed  Thank you for trusting me with your gastrointestinal care!    Alonza Bogus, PA-C
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
## 2021-02-18 ENCOUNTER — Ambulatory Visit: Payer: 59 | Admitting: Gastroenterology

## 2021-10-01 ENCOUNTER — Ambulatory Visit: Payer: Self-pay | Admitting: Podiatry

## 2021-10-20 ENCOUNTER — Ambulatory Visit: Payer: BLUE CROSS/BLUE SHIELD | Admitting: Podiatry

## 2023-11-10 ENCOUNTER — Encounter: Payer: Self-pay | Admitting: Neurology

## 2023-11-27 ENCOUNTER — Other Ambulatory Visit: Payer: Self-pay

## 2023-11-27 DIAGNOSIS — R202 Paresthesia of skin: Secondary | ICD-10-CM

## 2023-12-12 ENCOUNTER — Ambulatory Visit: Payer: BLUE CROSS/BLUE SHIELD | Admitting: Neurology

## 2023-12-12 DIAGNOSIS — R202 Paresthesia of skin: Secondary | ICD-10-CM

## 2023-12-12 DIAGNOSIS — M5412 Radiculopathy, cervical region: Secondary | ICD-10-CM

## 2023-12-12 NOTE — Procedures (Signed)
 [facility]  [address] [place], [address]  [place], [place] [address] Tel: [phone] Fax: [phone] Test Date:  12/12/2023  Patient: Francisco Carpenter DOB: 12/30/1965 Physician: Jacquelyne Balint, MD  Sex: Male Height: 5\' 11"  Ref Phys: Estill Bamberg, MD; Mack Hook, MD  ID#: [id number]   Technician:    History: This is a 58 year old male with left forearm numbness.  NCV & EMG Findings: Extensive electrodiagnostic evaluation of the left upper limb with additional nerve conduction studies of the right upper limb shows: Bilateral ulnar, left median, and left radial sensory responses are within normal limits. Left ulnar (ADM) motor response shows reduced amplitude (6.1 mV). Left median (APB) motor response is within normal limits. Chronic motor axon loss changes without accompanying active denervation changes are seen in the left first dorsal interosseous, abductor digiti minimi, extensor indicis proprius, pronator teres, and triceps muscles. Cervical paraspinal muscles were deferred due to prior cervical spine surgery.  Impression: This is an abnormal study. 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 The findings are moderate to severe at C8 and moderate at C7. No definitive electrodiagnostic evidence of an overlapping left ulnar mononeuropathy, though residuals may be difficult to detect due to #1 above. No electrodiagnostic evidence of a left median mononeuropathy at or distal to the wrist.    ___________________________ Jacquelyne Balint, MD    Nerve Conduction Studies Motor Nerve Results    Latency Amplitude F-Lat Segment Distance CV Comment  Site (ms) Norm (mV) Norm (ms)  (cm) (m/s) Norm   Left Median (APB) Motor  Wrist 2.6  < 4.0 12.9  > 6.0        Elbow 8.2 - 11.8 -  Elbow-Wrist 31 55  > 50   Left Ulnar (ADM) Motor  Wrist 2.3  < 3.1 *6.1  > 7.0         Bel elbow 6.4 - 5.8 -  Bel elbow-Wrist 22 54  > 50   Ab elbow 8.3 - 5.8 -  Ab elbow-Bel elbow 10 53 -    Sensory Sites    Neg Peak Lat Amplitude (O-P) Segment Distance Velocity Comment  Site (ms) Norm (V) Norm  (cm) (ms)   Left Median Sensory  Wrist-Dig II 2.9  < 3.6 28  > 15 Wrist-Dig II 13    Left Radial Sensory  Forearm-Wrist 2.1  < 2.7 21  > 14 Forearm-Wrist 10    Left Ulnar Sensory  Wrist-Dig V 3.1  < 3.1 11  > 10 Wrist-Dig V 11    Right Ulnar Sensory  Wrist-Dig V 3.0  < 3.1 14  > 10 Wrist-Dig V 11     Electromyography   Side Muscle Ins.Act Fibs Fasc Recrt Amp Dur Poly Activation Comment  Left FDI Nml Nml Nml *3- *2+ *2+ *1+ Nml *ATR  Left ADM Nml Nml Nml *3- *2+ *2+ *1+ Nml *ATR  Left EIP Nml Nml Nml *3- *2+ *2+ *1+ Nml N/A  Left Pronator teres Nml Nml Nml *2- *1+ *1+ *1+ Nml N/A  Left Biceps Nml Nml Nml Nml Nml Nml Nml Nml N/A  Left Triceps Nml Nml Nml *2- *1+ *1+ *1+ Nml N/A  Left Deltoid Nml Nml Nml Nml Nml Nml Nml Nml N/A      Waveforms:  Motor      Sensory

## 2024-02-27 ENCOUNTER — Encounter: Payer: Self-pay | Admitting: Podiatry

## 2024-02-27 ENCOUNTER — Ambulatory Visit (INDEPENDENT_AMBULATORY_CARE_PROVIDER_SITE_OTHER): Admitting: Podiatry

## 2024-02-27 ENCOUNTER — Ambulatory Visit (INDEPENDENT_AMBULATORY_CARE_PROVIDER_SITE_OTHER)

## 2024-02-27 DIAGNOSIS — M778 Other enthesopathies, not elsewhere classified: Secondary | ICD-10-CM | POA: Diagnosis not present

## 2024-02-27 DIAGNOSIS — M7671 Peroneal tendinitis, right leg: Secondary | ICD-10-CM

## 2024-02-27 DIAGNOSIS — M25571 Pain in right ankle and joints of right foot: Secondary | ICD-10-CM

## 2024-02-27 NOTE — Progress Notes (Addendum)
 Patient presents with complaint of pain in the lateral aspect of the right foot.  Has been bothering him for several weeks.  Pain with walking and wearing shoes.  Does not recall any inciting injury.  Has not noticed any redness or ecchymosis.  We did surgery on his Achilles tendon several years ago and he has been doing fine without without complaint.  Does not notice any tingling or burning.   Physical exam:  General appearance: Pleasant, and in no acute distress. AOx3.  Vascular: Pedal pulses: DP palpable bilaterally, PT pulse bilaterally.  Minimal edema lower legs bilaterally. Capillary fill time immediate.  Neurological: Light touch intact feet bilaterally.  Normal Achilles reflex bilaterally.  No clonus or spasticity noted.  Negative Tinel's sign sural nerve right.  Negative Tinel's sign tarsal tunnel and porta pedis right.  Dermatologic:   Skin normal temperature bilaterally.  Skin normal color, tone, and texture bilaterally.   Musculoskeletal: Tenderness at base of fifth metatarsal right.  Tenderness along the distal portion of the peroneus brevis tear at tendon.  No tenderness at the fourth fifth met cuboid joint.  No tenderness  range of motion of the fourth metatarsal fourth-fifth metatarsal cuboid joint.  Some tenderness palpation at the sinus tarsi.  No tenderness with range of motion of the subtalar joint right.  Radiographs: 3 views right foot: No evidence of any fractures.  Light met adductus deformity.  Nones any bone tumors.  No evidence of any osteophytic changes at the base of the fifth metatarsal.  No signs of any arthritis in the fifth met cuboid joint.  Diagnosis: 1.  Peroneal tendinitis right. 2.  Arthralgia subtalar joint right. 3. Capsulitis right foot.  Plan: -new office visit level 3 for evaluation and management. - Discussed with him pain at the peroneal tendon insertion and the etiology and treatment of this.  Discussed that is normally treatable by  conservative means.  Some tenderness he has in the sinus tarsi/STJ is probably from mild pes planus deformity -injected 3cc 2:1 mixture 0.5 cc Marcaine :Kenolog 10mg /37ml at peroneus brevis tendon insertion longus tendon sheath..   -Discussed proper shoes. - Recommended icing 20 minutes 3 times daily. -Dispensed OTC insoles  Return 2 weeks follow-up injection peroneus brevis tendon right

## 2024-03-13 ENCOUNTER — Ambulatory Visit: Admitting: Podiatry

## 2024-05-29 ENCOUNTER — Ambulatory Visit: Admitting: Internal Medicine

## 2024-05-29 ENCOUNTER — Encounter: Payer: Self-pay | Admitting: Internal Medicine

## 2024-05-29 VITALS — BP 120/60 | HR 76 | Ht 72.0 in | Wt 235.8 lb

## 2024-05-29 DIAGNOSIS — K429 Umbilical hernia without obstruction or gangrene: Secondary | ICD-10-CM | POA: Diagnosis not present
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
## 2024-05-29 NOTE — Patient Instructions (Addendum)
 We are referring you to [facility].  They will contact you directly to schedule an appointment.  It may take a week or more before you hear from them.  Please feel free to contact us  if you have not heard from them within 2 weeks and we will follow up on the referral.   Thank you for entrusting me with your care and for choosing [facility], Dr. Lupita Commander  _______________________________________________________  If your blood pressure at your visit was 140/90 or greater, please contact your primary care physician to follow up on this.  _______________________________________________________  If you are age 26 or older, your body mass index should be between 23-30. Your Body mass index is 31.98 kg/m. If this is out of the aforementioned range listed, please consider follow up with your Primary Care Provider.  If you are age 30 or younger, your body mass index should be between 19-25. Your Body mass index is 31.98 kg/m. If this is out of the aformentioned range listed, please consider follow up with your Primary Care Provider.   ________________________________________________________  The [facility] providers would like to encourage you to use MYCHART to communicate with providers for non-urgent requests or questions.  Due to long hold times on the telephone, sending your provider a message by [facility] may be a faster and more efficient way to get a response.  Please allow 48 business hours for a response.  Please remember that this is for non-urgent requests.  _______________________________________________________  [facility] is using a team-based approach to care.  Your team is made up of your doctor and two to three APPS. Our APPS (Nurse Practitioners and Physician Assistants) work with your physician to ensure care continuity for you. They are fully qualified to address your health concerns and develop a treatment plan. They communicate directly with  your gastroenterologist to care for you. Seeing the Advanced Practice Practitioners on your physician's team can help you by facilitating care more promptly, often allowing for earlier appointments, access to diagnostic testing, procedures, and other specialty referrals.
